# Patient Record
Sex: Male | Born: 1985 | Race: Black or African American | Hispanic: No | Marital: Single | Smoking: Current every day smoker
Health system: Southern US, Community
[De-identification: ages and names within clinical notes are randomized; demographics above are authoritative.]

## PROBLEM LIST (undated history)

## (undated) DIAGNOSIS — R011 Cardiac murmur, unspecified: Secondary | ICD-10-CM

## (undated) DIAGNOSIS — J189 Pneumonia, unspecified organism: Secondary | ICD-10-CM

## (undated) HISTORY — PX: NO PAST SURGERIES: SHX2092

---

## 2013-03-01 ENCOUNTER — Encounter (HOSPITAL_COMMUNITY): Payer: Self-pay | Admitting: Emergency Medicine

## 2013-03-01 ENCOUNTER — Emergency Department (HOSPITAL_COMMUNITY): Payer: Self-pay

## 2013-03-01 ENCOUNTER — Inpatient Hospital Stay (HOSPITAL_COMMUNITY)
Admission: EM | Admit: 2013-03-01 | Discharge: 2013-03-03 | DRG: 419 | Disposition: A | Discharge status: Home / Self Care | Payer: MEDICAID | Attending: General Surgery | Admitting: General Surgery

## 2013-03-01 DIAGNOSIS — R933 Abnormal findings on diagnostic imaging of other parts of digestive tract: Secondary | ICD-10-CM

## 2013-03-01 DIAGNOSIS — R1013 Epigastric pain: Secondary | ICD-10-CM

## 2013-03-01 DIAGNOSIS — R7402 Elevation of levels of lactic acid dehydrogenase (LDH): Secondary | ICD-10-CM

## 2013-03-01 DIAGNOSIS — K8010 Calculus of gallbladder with chronic cholecystitis without obstruction: Secondary | ICD-10-CM

## 2013-03-01 DIAGNOSIS — F17200 Nicotine dependence, unspecified, uncomplicated: Secondary | ICD-10-CM | POA: Diagnosis present

## 2013-03-01 DIAGNOSIS — R7401 Elevation of levels of liver transaminase levels: Secondary | ICD-10-CM | POA: Diagnosis present

## 2013-03-01 DIAGNOSIS — K8020 Calculus of gallbladder without cholecystitis without obstruction: Secondary | ICD-10-CM

## 2013-03-01 DIAGNOSIS — K810 Acute cholecystitis: Secondary | ICD-10-CM

## 2013-03-01 DIAGNOSIS — K389 Disease of appendix, unspecified: Secondary | ICD-10-CM | POA: Diagnosis present

## 2013-03-01 DIAGNOSIS — K8000 Calculus of gallbladder with acute cholecystitis without obstruction: Principal | ICD-10-CM | POA: Diagnosis present

## 2013-03-01 DIAGNOSIS — R932 Abnormal findings on diagnostic imaging of liver and biliary tract: Secondary | ICD-10-CM

## 2013-03-01 HISTORY — DX: Pneumonia, unspecified organism: J18.9

## 2013-03-01 HISTORY — DX: Cardiac murmur, unspecified: R01.1

## 2013-03-01 LAB — POCT I-STAT, CHEM 8
Calcium, Ion: 1.19 mmol/L (ref 1.12–1.23)
Glucose, Bld: 106 mg/dL — ABNORMAL HIGH (ref 70–99)
HCT: 52 % (ref 39.0–52.0)
Hemoglobin: 17.7 g/dL — ABNORMAL HIGH (ref 13.0–17.0)
Potassium: 3.3 mEq/L — ABNORMAL LOW (ref 3.5–5.1)

## 2013-03-01 LAB — CBC
HCT: 46.8 % (ref 39.0–52.0)
Hemoglobin: 16.9 g/dL (ref 13.0–17.0)
MCV: 88.5 fL (ref 78.0–100.0)
Platelets: 249 10*3/uL (ref 150–400)
RBC: 5.29 MIL/uL (ref 4.22–5.81)
WBC: 12.1 10*3/uL — ABNORMAL HIGH (ref 4.0–10.5)

## 2013-03-01 LAB — COMPREHENSIVE METABOLIC PANEL
AST: 507 U/L — ABNORMAL HIGH (ref 0–37)
CO2: 26 mEq/L (ref 19–32)
Chloride: 99 mEq/L (ref 96–112)
Creatinine, Ser: 0.87 mg/dL (ref 0.50–1.35)
GFR calc non Af Amer: >90 mL/min (ref >90)
Glucose, Bld: 109 mg/dL — ABNORMAL HIGH (ref 70–99)
Total Bilirubin: 3.6 mg/dL — ABNORMAL HIGH (ref 0.3–1.2)

## 2013-03-01 MED ORDER — SODIUM CHLORIDE 0.9 % IV BOLUS (SEPSIS)
1000.0000 mL | Freq: Once | INTRAVENOUS | Status: AC
  Administered 2013-03-01: 1000 mL via INTRAVENOUS

## 2013-03-01 MED ORDER — ONDANSETRON HCL 4 MG/2ML IJ SOLN
4.0000 mg | Freq: Once | INTRAMUSCULAR | Status: AC
  Administered 2013-03-01: 4 mg via INTRAVENOUS

## 2013-03-01 MED ORDER — ENOXAPARIN SODIUM 30 MG/0.3ML ~~LOC~~ SOLN
30.0000 mg | SUBCUTANEOUS | Status: DC
  Administered 2013-03-01: 30 mg via SUBCUTANEOUS
  Filled 2013-03-01 (×3): qty 0.3

## 2013-03-01 MED ORDER — ONDANSETRON HCL 4 MG/2ML IJ SOLN
4.0000 mg | Freq: Four times a day (QID) | INTRAMUSCULAR | Status: DC | PRN
Start: 2013-03-01 — End: 2013-03-03
  Administered 2013-03-01 – 2013-03-02 (×2): 4 mg via INTRAVENOUS
  Filled 2013-03-01 (×3): qty 2

## 2013-03-01 MED ORDER — ACETAMINOPHEN 325 MG PO TABS: 650.0000 mg | ORAL_TABLET | Freq: Four times a day (QID) | ORAL | Status: DC | PRN

## 2013-03-01 MED ORDER — IOHEXOL 300 MG/ML  SOLN
80.0000 mL | Freq: Once | INTRAMUSCULAR | Status: AC | PRN
  Administered 2013-03-01: 80 mL via INTRAVENOUS

## 2013-03-01 MED ORDER — IOHEXOL 300 MG/ML  SOLN
25.0000 mL | INTRAMUSCULAR | Status: DC
  Administered 2013-03-01: 25 mL via ORAL

## 2013-03-01 MED ORDER — ACETAMINOPHEN 650 MG RE SUPP: 650.0000 mg | Freq: Four times a day (QID) | RECTAL | Status: DC | PRN

## 2013-03-01 MED ORDER — HYDROMORPHONE HCL PF 1 MG/ML IJ SOLN
1.0000 mg | Freq: Once | INTRAMUSCULAR | Status: AC
  Administered 2013-03-01: 1 mg via INTRAVENOUS
  Filled 2013-03-01: qty 1

## 2013-03-01 MED ORDER — POTASSIUM CHLORIDE IN NACL 20-0.9 MEQ/L-% IV SOLN
INTRAVENOUS | Status: DC
  Administered 2013-03-01 – 2013-03-03 (×2): via INTRAVENOUS
  Filled 2013-03-01 (×9): qty 1000

## 2013-03-01 MED ORDER — PANTOPRAZOLE SODIUM 40 MG IV SOLR
40.0000 mg | Freq: Every day | INTRAVENOUS | Status: DC
  Administered 2013-03-01 – 2013-03-02 (×2): 40 mg via INTRAVENOUS
  Filled 2013-03-01 (×3): qty 40

## 2013-03-01 MED ORDER — HYDROMORPHONE HCL PF 1 MG/ML IJ SOLN
1.0000 mg | Freq: Once | INTRAMUSCULAR | Status: DC
  Filled 2013-03-01: qty 1

## 2013-03-01 MED ORDER — CIPROFLOXACIN IN D5W 400 MG/200ML IV SOLN
400.0000 mg | Freq: Two times a day (BID) | INTRAVENOUS | Status: DC
  Administered 2013-03-01 – 2013-03-03 (×4): 400 mg via INTRAVENOUS
  Filled 2013-03-01 (×6): qty 200

## 2013-03-01 NOTE — ED Notes (Signed)
Per Pt: Pt has had abdominal pain since Saturday morning after drinking on Friday night. Saturday afternoon pt started having n/v. Patient c/o stabbing abdominal pain.

## 2013-03-01 NOTE — Consult Note (Signed)
[facility] Consult: 2:26 PM 03/01/2013   Referring Provider: Dr Janee Morn Primary Care Physician:   Primary Gastroenterologist:  Un assigned  Reason for Consultation:  Abnormal LFTs  HPI: Kenneth Gallagher is a 27 y.o. male.  He is visiting friends locally, but resides in [place] and is generally healthy.  Suffers from occasional heartburn if he eats spicey foods or drinks ETOh beverages to excess.  His ETOH intake is 2 to 3 beers 3 days per week.   On 3/22 PM drank 2 beers and a Smirno[employer] Ice. (Normally would have to have hard liquor in order to suffer GI s/e from drinking).  Began to have diffuse abdominal pain and n/v.  This persisted through yesterday and he took OTC laxative thinking this may helpl though had good BM on 3/22.  Had BMs of brown , loose stool but the pain and n/v persisted.  Neither Bismuth or Phenergen from his friends supply helped.  Came to ED for unrelenting sxs this AM.    Labs show transamninases in 500s, not an ETOH pattern.  T bili is 3.6 and alk phos is 210.  Lipase is 50.   Ultrasound shows GB stones, thickened GB wall.  CBD is 5.7 MM.  Liver is normal.  CT scan shows thickened base of appendix, can not rule out appendicitis; sludge vs stones seen in GB.  After meds for pain and nausea in ED, he is free of sxs though belly still a bit sore.   Tested for HIV and Hep B about one year ago and was negative.  No ilicit or IVDA.     Past Medical History  Diagnosis Date  . Heart murmur     resolved as a child  . Pneumonia     when a child    Past Surgical History  Procedure Laterality Date  . No past surgeries      Prior to Admission medications   Not on File    Scheduled Meds: . ciprofloxacin  400 mg Intravenous Q12H  . enoxaparin (LOVENOX) injection  30 mg Subcutaneous Q24H  .  HYDROmorphone (DILAUDID) injection  1 mg Intravenous Once  . pantoprazole (PROTONIX) IV  40 mg Intravenous QHS    Infusions: . 0.9 % NaCl with KCl 20 mEq / L     PRN Meds: acetaminophen, acetaminophen, ondansetron   Allergies as of 03/01/2013  . (No Known Allergies)    No family history on file.  History   Social History  . Marital Status: Single    Spouse Name: N/A    Number of Children: N/A  . Years of Education: N/A   Occupational History  . Not on file.   Social History Main Topics  . Smoking status: Current Every Day Smoker    Types: Cigarettes  . Smokeless tobacco: Not on file     Comment: Smokes 1-2 cigs everytime he drinks etoh (1-2x/week)  . Alcohol Use: 1.5 - 2 oz/week    3-4 drink(s) per week  . Drug Use: Not on file  . Sexually Active: Not on file   Other Topics Concern  . Not on file   Social History Narrative  . No narrative on file    REVIEW OF SYSTEMS: Weight stable Rare use of Ibuprofen a few times per month. No skin sores or rashes.  No dark color urine.  No itching   PHYSICAL EXAM: Vital signs in last 24 hours: Temp:  [98.4 F (36.9 C)-99 F (37.2 C)] 99 F (  37.2 C) (03/24 1350) Pulse Rate:  [64-99] 74 (03/24 1350) Resp:  [18] 18 (03/24 0457) BP: (127-156)/(73-103) 131/78 mmHg (03/24 1350) SpO2:  [95 %-100 %] 98 % (03/24 1350) Weight:  [86.138 kg (189 lb 14.4 oz)] 86.138 kg (189 lb 14.4 oz) (03/24 1350)  General: look well.  Pleasant Head:  No swelling or asymmetry  Eyes:  No icterus or pallor.  EOMI Ears:  Not HOH  Nose:  No discharge or congestion Mouth:  Pink, moist, clear oral MM Neck:  No JVD, no mass, no TMG Lungs:  Clear.  Breathing easily Heart: RRR.  No MRG Abdomen:  Soft, active BS, ND.  Minimal tenderness in mid abdomen.   Rectal: not done   Musc/Skeltl: no joint swelling or deformity Extremities:  No pedal edema  Neurologic:  Moves all 4s, oriented x 3.  Very alert.  No gross deficits Skin:  No rash, no sores, no jaundice Tattoos:  None seen Nodes:  No inguinal adenopathy   Psych:  Pleasant and cooperative.    Intake/Output from previous day:   Intake/Output this shift:    LAB RESULTS:  Recent Labs  03/01/13 0542 03/01/13 0620  WBC 12.1*  --   HGB 16.9 17.7*  HCT 46.8 52.0  PLT 249  --    BMET Lab Results  Component Value Date   NA 141 03/01/2013   NA 139 03/01/2013   K 3.3* 03/01/2013   K 3.5 03/01/2013   CL 103 03/01/2013   CL 99 03/01/2013   CO2 26 03/01/2013   GLUCOSE 106* 03/01/2013   GLUCOSE 109* 03/01/2013   BUN 8 03/01/2013   BUN 9 03/01/2013   CREATININE 1.00 03/01/2013   CREATININE 0.87 03/01/2013   CALCIUM 10.0 03/01/2013   LFT  Recent Labs  03/01/13 0542  PROT 9.2*  ALBUMIN 4.8  AST 507*  ALT 557*  ALKPHOS 210*  BILITOT 3.6*   PT/INR No results found for this basename: INR, PROTIME   Hepatitis Panel No results found for this basename: HEPBSAG, HCVAB, HEPAIGM, HEPBIGM,  in the last 72 hours C-Diff No components found with this basename: cdiff    Drugs of Abuse  No results found for this basename: labopia, cocainscrnur, labbenz, amphetmu, thcu, labbarb     RADIOLOGY STUDIES: US Abdomen Complete 03/01/2013  *RADIOLOGY REPORT*  Clinical Data:  Abdominal pain  ULTRASOUND ABDOMEN:  Technique:  Sonography of upper abdominal structures was performed.  Comparison:  None Correlation:  CT abdomen 03/01/2013  Gallbladder:  Shadowing calculi in gallbladder up to 13 mm diameter.  Gallbladder wall is thickened up to 5 mm thick.  No definite pericholecystic fluid.  Unable to assess for presence of a sonographic Murphy's sign due to prior medication administration, per technologist.  Common bile duct:  5 mm diameter, normal  Liver:  Normal appearance.  Hepatopetal portal venous flow.  IVC:  Normal appearance  Pancreas:  Normal appearance  Spleen:  Normal appearance, 5.7 cm length  Right kidney:  11.2 cm length. Normal morphology without mass or hydronephrosis.  Left kidney:  11.5 cm length. Normal morphology without mass or hydronephrosis.  Aorta:  Bifurcation obscured by bowel  gas. Visualized aorta normal caliber.  Other:  No right upper quadrant ascites identified.  Minimal right pleural effusion noted.  IMPRESSION: Multiple shadowing calculi evident within gallbladder with associated gallbladder wall thickening, raising question of acute cholecystitis. Unable to assess for presence of a sonographic Murphy's sign due to reported prior medication administration. No biliary dilatation. Minimal  right pleural effusion.   Original Report Authenticated By: Ulyses Southward, M.D.    Ct Abdomen Pelvis W Contrast 03/01/2013 .  Findings: Dependent atelectasis noted in the lung bases.  No focal abnormalities seen in the liver or spleen.  The stomach, duodenum, pancreas, and adrenal glands are unremarkable. Tumefactive sludge or stones noted in the lumen of the gallbladder. The kidneys are normal in appearance.  No abdominal aortic aneurysm.  No free fluid or lymphadenopathy in the abdomen. Imaging through the pelvis shows no free intraperitoneal fluid.  The bladder is unremarkable.  No evidence for colonic diverticulitis.  The terminal ileum is normal.  The base of the appendix is abnormally thickened at 11 mm diameter. The walls are somewhat ill-defined, but there is not a substantial amount of periappendiceal edema or inflammation.  The midportion of the appendix and the tip of the appendix are nondilated. Although the terminal ileum and cecum are well opacified, no contrast flows into the appendix.  Bone windows reveal no worrisome lytic or sclerotic osseous lesions.  IMPRESSION: There is abnormal thickening involving the base of the appendix where it measures up to 11 mm in diameter. The appendiceal lumen is not opacified.  The mid appendix and tip in the appendix are nondilated. Although there is not a substantial amount of periappendiceal edema or inflammation, acute appendicitis cannot be excluded.  Several rounded areas of altered attenuation in the gallbladder lumen.  These are probably  uncalcified stones although tumefactive sludge could have this appearance.  I discussed these findings by telephone with Dr. Jeraldine Loots at approximately [phone] on 03/01/2013.   Original Report Authenticated By: Kennith Center, M.D.     ENDOSCOPIC STUDIES: None ever  IMPRESSION: *  Acute abdominal pain with non bloody N/V and transaminases in 500s, not an alcoholic transaminase pattern.  Suspect symptomatic gall stones/sludge.  ?passed stone, ? Appendicitis.  Although he may have some etoh related gastritis, do not believer the 3 drinks he had on 3/22 account for his presenting sxs and labs.   PLAN: *  Check LFTs in AM *  No plans for Endoscopy at present.  Will start clears.  Not clear he needs abx.  *  Dr Janee Morn to see pt, has been seen by surgical PA who called me.    LOS: 0 days   Jennye Moccasin  03/01/2013, 2:26 PM Pager: [phone]  GI ATTENDING   Patient seen and examined. Laboratories and x-rays reviewed. He presents with acute abdominal pain, nausea, vomiting, and significantly elevated liver tests. Imaging reveals cholelithiasis and thickened gallbladder wall. The clinical picture is most consistent with symptomatic cholelithiasis. Possibly choledocholithiasis. I do not think this is alcohol related liver disease. Doubt appendicitis. Agree with continuing antibiotics, IV fluids, and monitoring liver tests. If his liver tests continue to improve, would proceed with laparoscopic cholecystectomy and intraoperative cholangiogram. We will follow. Thank you  Wilhemina Bonito. Eda Keys., M.D. [facility] of Gastroenterology

## 2013-03-01 NOTE — ED Provider Notes (Signed)
History     CSN: [id number]  Arrival date & time 03/01/13  4098   First MD Initiated Contact with Patient 03/01/13 [phone]      Chief Complaint  Patient presents with  . Vomiting    (Consider location/radiation/quality/duration/timing/severity/associated sxs/prior treatment) HPI History provided by patient. Sick the last 5 days. Abdominal pain for last 2 days. Pain located lower abdomen and is associated with nausea and vomiting. Pain described as sharp and constant without known alleviating factors. Patient did have some diarrhea a few days ago took over-the-counter medications and is concerned this may have caused this pain. No blood in emesis or stools. No history of same. No back pain. No hematuria. History reviewed. No pertinent past medical history.  History reviewed. No pertinent past surgical history.  No family history on file.  History  Substance Use Topics  . Smoking status: Not on file  . Smokeless tobacco: Not on file  . Alcohol Use: Not on file      Review of Systems  Constitutional: Negative for fever and chills.  HENT: Negative for neck pain and neck stiffness.   Eyes: Negative for pain.  Respiratory: Negative for shortness of breath.   Cardiovascular: Negative for chest pain.  Gastrointestinal: Positive for abdominal pain. Negative for blood in stool.  Genitourinary: Negative for dysuria.  Musculoskeletal: Negative for back pain.  Skin: Negative for rash.  Neurological: Negative for headaches.  All other systems reviewed and are negative.    Allergies  Review of patient's allergies indicates no known allergies.  Home Medications  No current outpatient prescriptions on file.  BP 145/98  Pulse 64  Temp(Src) 98.4 F (36.9 C)  Resp 18  SpO2 100%  Physical Exam  Constitutional: He is oriented to person, place, and time. He appears well-developed and well-nourished.  HENT:  Head: Normocephalic and atraumatic.  Eyes: EOM are normal. Pupils are  equal, round, and reactive to light.  Neck: Neck supple.  Cardiovascular: Normal rate, regular rhythm and intact distal pulses.   Pulmonary/Chest: Effort normal and breath sounds normal. No respiratory distress.  Abdominal: Soft. Bowel sounds are normal. He exhibits no distension and no mass.  Tender right lower quadrant and left lower quadrant with voluntary guarding. No CVA tenderness. Negative Murphy sign  Musculoskeletal: Normal range of motion. He exhibits no edema.  Neurological: He is alert and oriented to person, place, and time.  Skin: Skin is warm and dry.    ED Course  Procedures (including critical care time)  Results for orders placed during the hospital encounter of 03/01/13  CBC      Result Value Range   WBC 12.1 (*) 4.0 - 10.5 K/uL   RBC 5.29  4.22 - 5.81 MIL/uL   Hemoglobin 16.9  13.0 - 17.0 g/dL   HCT 47.8  29.5 - 62.1 %   MCV 88.5  78.0 - 100.0 fL   MCH 31.9  26.0 - 34.0 pg   MCHC 36.1 (*) 30.0 - 36.0 g/dL   RDW 30.8  65.7 - 84.6 %   Platelets 249  150 - 400 K/uL  COMPREHENSIVE METABOLIC PANEL      Result Value Range   Sodium 139  135 - 145 mEq/L   Potassium 3.5  3.5 - 5.1 mEq/L   Chloride 99  96 - 112 mEq/L   CO2 26  19 - 32 mEq/L   Glucose, Bld 109 (*) 70 - 99 mg/dL   BUN 9  6 - 23 mg/dL  Creatinine, Ser 0.87  0.50 - 1.35 mg/dL   Calcium 40.9  8.4 - 81.1 mg/dL   Total Protein 9.2 (*) 6.0 - 8.3 g/dL   Albumin 4.8  3.5 - 5.2 g/dL   AST 914 (*) 0 - 37 U/L   ALT 557 (*) 0 - 53 U/L   Alkaline Phosphatase 210 (*) 39 - 117 U/L   Total Bilirubin 3.6 (*) 0.3 - 1.2 mg/dL   GFR calc non Af Amer >90  >90 mL/min   GFR calc Af Amer >90  >90 mL/min  POCT I-STAT, CHEM 8      Result Value Range   Sodium 141  135 - 145 mEq/L   Potassium 3.3 (*) 3.5 - 5.1 mEq/L   Chloride 103  96 - 112 mEq/L   BUN 8  6 - 23 mg/dL   Creatinine, Ser 7.82  0.50 - 1.35 mg/dL   Glucose, Bld 956 (*) 70 - 99 mg/dL   Calcium, Ion 2.13  0.86 - 1.23 mmol/L   TCO2 28  0 - 100 mmol/L    Hemoglobin 17.7 (*) 13.0 - 17.0 g/dL   HCT 57.8  46.9 - 62.9 %   IV fluids. IV Zofran. IV Dilaudid  CT scan ordered to evaluate symptoms - pending at 7 AM and patient care transferred to Dr. Jeraldine Loots MDM   Abdominal pain with elevated liver enzymes and bilirubin  IV fluids. IV narcotics        Sunnie Nielsen, MD 03/02/13 [phone]

## 2013-03-01 NOTE — H&P (Signed)
Alcohol induced hepatitis vs cholecystitis/choledocholithiasis.  Plan GI consult.  If work-up C/W cholecystitis, will evaluate appendix at time of lap chole. I D/W patient and answered his questions. Patient examined and I agree with the assessment and plan  Violeta Gelinas, MD, MPH, FACS Pager: [phone]  03/01/2013 3:49 PM

## 2013-03-01 NOTE — H&P (Signed)
Kenneth Gallagher 26-Sep-1986  [id number].    Requesting MD: Dr. Dierdre Highman Chief Complaint/Reason for Admission: Abdominal pain, nausea, vomiting HPI:  27 y/o male presents to [facility] after 3 days of progressively worsening abdominal pain.  He notes on Friday night he went out drinking with his friend.  He is from [place] here visiting a friend in the [place] area.  He states on Saturday am he started having abdominal pain and later that afternoon he was having nausea and vomiting.  He was having trouble keeping it down.  The pain became so unbearable today that he came to the hospital for evaluation.  The patient said the pain was in his lower abdomen.  It was also associated with diarrhea and anorexia.  No blood in emesis or stools.  The patient notes he has always had "nerve issues" with his stomach.  He also notes the last few times he drank he got sick for a few days.  The pt admits to drinking >3 drinks last night, but is usually a beer or smirnoff drink.  He states he has tried cut back on hard liquor he drinks.  He denies any IVDU or other drug use, no blood transfusions.  No h/o jaundice.  No recent tylenol use.  Recent travel from [place] to [place].  No medical conditions or past surgeries.    Workup showed transaminitis (AST 507, ALT 557), elevated total bilirubin (3.6), elevated alk phos (210).  CT showed abnormal thickening of the base of the appendix (11mm), and the gallbladder on ultrasound showed gallbladder wall thickened, stones and sludge.  No biliary dilatation.  Right pleural effusion on ultrasound, normal liver on CT.  The patient is currently asymptomatic (no abdominal pain, N/V, but is very emotional about the possibility of having surgery).  He is also apprehensive about having surgery here since he is from [place] and is just here visiting.     No family history on file.  History reviewed. No pertinent past medical history.  History reviewed. No pertinent past  surgical history.  Social History:  reports that he has been smoking Cigarettes.  He has been smoking about 0.00 packs per day. He does not have any smokeless tobacco history on file. He reports that he drinks about 1.5 ounces of alcohol per week. His drug history is not on file.  Allergies: No Known Allergies   (Not in a hospital admission)  Blood pressure 134/73, pulse 93, temperature 98.6 F (37 C), temperature source Oral, resp. rate 18, SpO2 99.00%. Physical Exam: General: pleasant, WD/WN male who is laying in bed in NAD, but anxious HEENT: head is normocephalic, atraumatic.  Sclera are noninjected.  PERRL.  Ears and nose without any masses or lesions.  Mouth is pink and moist Heart: regular, rate, and rhythm.  No obvious murmurs, gallops, or rubs noted.  Palpable pedal pulses bilaterally Lungs: CTAB, no wheezes, rhonchi, or rales noted.  Respiratory effort nonlabored Abd: soft, NT/ND, +BS, no masses, hernias, or organomegaly MS: all 4 extremities are symmetrical with no cyanosis, clubbing, or edema. Skin: warm and dry with no masses, lesions, or rashes Psych: A&Ox3 with an appropriate affect.  Results for orders placed during the hospital encounter of 03/01/13 (from the past 48 hour(s))  CBC     Status: Abnormal   Collection Time    03/01/13  5:42 AM      Result Value Range   WBC 12.1 (*) 4.0 - 10.5 K/uL   RBC 5.29  4.22 - 5.81 MIL/uL   Hemoglobin 16.9  13.0 - 17.0 g/dL   HCT 04.5  40.9 - 81.1 %   MCV 88.5  78.0 - 100.0 fL   MCH 31.9  26.0 - 34.0 pg   MCHC 36.1 (*) 30.0 - 36.0 g/dL   RDW 91.4  78.2 - 95.6 %   Platelets 249  150 - 400 K/uL  COMPREHENSIVE METABOLIC PANEL     Status: Abnormal   Collection Time    03/01/13  5:42 AM      Result Value Range   Sodium 139  135 - 145 mEq/L   Potassium 3.5  3.5 - 5.1 mEq/L   Chloride 99  96 - 112 mEq/L   CO2 26  19 - 32 mEq/L   Glucose, Bld 109 (*) 70 - 99 mg/dL   BUN 9  6 - 23 mg/dL   Creatinine, Ser 2.13  0.50 - 1.35 mg/dL    Calcium 08.6  8.4 - 10.5 mg/dL   Total Protein 9.2 (*) 6.0 - 8.3 g/dL   Albumin 4.8  3.5 - 5.2 g/dL   AST 578 (*) 0 - 37 U/L   ALT 557 (*) 0 - 53 U/L   Alkaline Phosphatase 210 (*) 39 - 117 U/L   Total Bilirubin 3.6 (*) 0.3 - 1.2 mg/dL   GFR calc non Af Amer >90  >90 mL/min   GFR calc Af Amer >90  >90 mL/min   Comment:            The eGFR has been calculated     using the CKD EPI equation.     This calculation has not been     validated in all clinical     situations.     eGFR's persistently     <90 mL/min signify     possible Chronic Kidney Disease.  LIPASE, BLOOD     Status: None   Collection Time    03/01/13  5:42 AM      Result Value Range   Lipase 50  11 - 59 U/L  POCT I-STAT, CHEM 8     Status: Abnormal   Collection Time    03/01/13  6:20 AM      Result Value Range   Sodium 141  135 - 145 mEq/L   Potassium 3.3 (*) 3.5 - 5.1 mEq/L   Chloride 103  96 - 112 mEq/L   BUN 8  6 - 23 mg/dL   Creatinine, Ser 4.69  0.50 - 1.35 mg/dL   Glucose, Bld 629 (*) 70 - 99 mg/dL   Calcium, Ion 5.28  4.13 - 1.23 mmol/L   TCO2 28  0 - 100 mmol/L   Hemoglobin 17.7 (*) 13.0 - 17.0 g/dL   HCT 24.4  01.0 - 27.2 %   US Abdomen Complete  03/01/2013  *RADIOLOGY REPORT*  Clinical Data:  Abdominal pain  ULTRASOUND ABDOMEN:  Technique:  Sonography of upper abdominal structures was performed.  Comparison:  None Correlation:  CT abdomen 03/01/2013  Gallbladder:  Shadowing calculi in gallbladder up to 13 mm diameter.  Gallbladder wall is thickened up to 5 mm thick.  No definite pericholecystic fluid.  Unable to assess for presence of a sonographic Murphy's sign due to prior medication administration, per technologist.  Common bile duct:  5 mm diameter, normal  Liver:  Normal appearance.  Hepatopetal portal venous flow.  IVC:  Normal appearance  Pancreas:  Normal appearance  Spleen:  Normal appearance, 5.7 cm length  Right kidney:  11.2 cm length. Normal morphology without mass or hydronephrosis.  Left  kidney:  11.5 cm length. Normal morphology without mass or hydronephrosis.  Aorta:  Bifurcation obscured by bowel gas. Visualized aorta normal caliber.  Other:  No right upper quadrant ascites identified.  Minimal right pleural effusion noted.  IMPRESSION: Multiple shadowing calculi evident within gallbladder with associated gallbladder wall thickening, raising question of acute cholecystitis. Unable to assess for presence of a sonographic Murphy's sign due to reported prior medication administration. No biliary dilatation. Minimal right pleural effusion.   Original Report Authenticated By: Ulyses Southward, M.D.    Ct Abdomen Pelvis W Contrast  03/01/2013  *RADIOLOGY REPORT*  Clinical Data: Diffuse abdominal pain with nausea vomiting.  CT ABDOMEN AND PELVIS WITH CONTRAST  Technique:  Multidetector CT imaging of the abdomen and pelvis was performed following the standard protocol during bolus administration of intravenous contrast.  Contrast: 80mL OMNIPAQUE IOHEXOL 300 MG/ML  SOLN the  Comparison: None.  Findings: Dependent atelectasis noted in the lung bases.  No focal abnormalities seen in the liver or spleen.  The stomach, duodenum, pancreas, and adrenal glands are unremarkable. Tumefactive sludge or stones noted in the lumen of the gallbladder. The kidneys are normal in appearance.  No abdominal aortic aneurysm.  No free fluid or lymphadenopathy in the abdomen. Imaging through the pelvis shows no free intraperitoneal fluid.  The bladder is unremarkable.  No evidence for colonic diverticulitis.  The terminal ileum is normal.  The base of the appendix is abnormally thickened at 11 mm diameter. The walls are somewhat ill-defined, but there is not a substantial amount of periappendiceal edema or inflammation.  The midportion of the appendix and the tip of the appendix are nondilated. Although the terminal ileum and cecum are well opacified, no contrast flows into the appendix.  Bone windows reveal no worrisome lytic or  sclerotic osseous lesions.  IMPRESSION: There is abnormal thickening involving the base of the appendix where it measures up to 11 mm in diameter. The appendiceal lumen is not opacified.  The mid appendix and tip in the appendix are nondilated. Although there is not a substantial amount of periappendiceal edema or inflammation, acute appendicitis cannot be excluded.  Several rounded areas of altered attenuation in the gallbladder lumen.  These are probably uncalcified stones although tumefactive sludge could have this appearance.  I discussed these findings by telephone with Dr. Jeraldine Loots at approximately [phone] hours on 03/01/2013.   Original Report Authenticated By: Kennith Center, M.D.        Assessment/Plan Lower abdominal pain - resolved Cholelithiasis, GB wall thickening, possible he has cholecystitis, but could be from hepatitis given LFT's Abnormal appendix, could be appendicitis, but currently asymptomatic ETOH use/tobacco use Nausea/vomiting - resolved Transaminitis - ETOH vs hepatitis  1.  Admit to [facility] service 2.  GI consult - Talked to Maralyn Sago [facility] from [facility] who seemed to think it was alcohol hepatitis, ordered Hepatitis panel 3.  IVF, pain control, antiemetics, antibiotics (cipro) 4.  NPO until Dr. Janee Morn can see and evaluate, may need HIDA scan? 5.  SCD's, Lovenox, ambulation 6.  Recheck labs in AM    [place], [facility] 03/01/2013, 10:43 AM Pager: [phone]

## 2013-03-01 NOTE — ED Provider Notes (Signed)
I reviewed the patient's radiographic studies, and discussed his case with our radiologists.  Given the elevated liver enzymes, concern for acute cholecystitis I spoke with our surgical team.  The patient remained in no distress, but will be admitted for further evaluation and management.  Gerhard Munch, MD 03/01/13 1125

## 2013-03-02 ENCOUNTER — Encounter (HOSPITAL_COMMUNITY): Admission: EM | Disposition: A | Discharge status: Home / Self Care | Payer: Self-pay | Source: Home / Self Care

## 2013-03-02 ENCOUNTER — Inpatient Hospital Stay (HOSPITAL_COMMUNITY): Payer: MEDICAID

## 2013-03-02 ENCOUNTER — Encounter (HOSPITAL_COMMUNITY): Payer: Self-pay | Admitting: Certified Registered"

## 2013-03-02 ENCOUNTER — Inpatient Hospital Stay (HOSPITAL_COMMUNITY): Payer: MEDICAID | Admitting: Certified Registered"

## 2013-03-02 DIAGNOSIS — R1013 Epigastric pain: Secondary | ICD-10-CM

## 2013-03-02 DIAGNOSIS — D126 Benign neoplasm of colon, unspecified: Secondary | ICD-10-CM

## 2013-03-02 DIAGNOSIS — K8020 Calculus of gallbladder without cholecystitis without obstruction: Secondary | ICD-10-CM

## 2013-03-02 DIAGNOSIS — R933 Abnormal findings on diagnostic imaging of other parts of digestive tract: Secondary | ICD-10-CM

## 2013-03-02 DIAGNOSIS — K810 Acute cholecystitis: Secondary | ICD-10-CM

## 2013-03-02 HISTORY — PX: CHOLECYSTECTOMY: SHX55

## 2013-03-02 HISTORY — PX: LAPAROSCOPIC APPENDECTOMY: SHX408

## 2013-03-02 LAB — SURGICAL PCR SCREEN: Staphylococcus aureus: NEGATIVE

## 2013-03-02 LAB — COMPREHENSIVE METABOLIC PANEL
AST: 251 U/L — ABNORMAL HIGH (ref 0–37)
Albumin: 3.7 g/dL (ref 3.5–5.2)
Alkaline Phosphatase: 194 U/L — ABNORMAL HIGH (ref 39–117)
BUN: 7 mg/dL (ref 6–23)
Potassium: 4.1 mEq/L (ref 3.5–5.1)
Sodium: 141 mEq/L (ref 135–145)
Total Protein: 7.4 g/dL (ref 6.0–8.3)

## 2013-03-02 LAB — CBC
HCT: 44.5 % (ref 39.0–52.0)
MCV: 90.1 fL (ref 78.0–100.0)
RBC: 4.94 MIL/uL (ref 4.22–5.81)
RDW: 13 % (ref 11.5–15.5)
WBC: 7.5 10*3/uL (ref 4.0–10.5)

## 2013-03-02 LAB — HEPATITIS PANEL, ACUTE
HCV Ab: NEGATIVE
Hep A IgM: NEGATIVE
Hep B C IgM: NEGATIVE
Hepatitis B Surface Ag: NEGATIVE

## 2013-03-02 SURGERY — LAPAROSCOPIC CHOLECYSTECTOMY WITH INTRAOPERATIVE CHOLANGIOGRAM
Anesthesia: General | Site: Abdomen | Wound class: Contaminated

## 2013-03-02 MED ORDER — BUPIVACAINE-EPINEPHRINE 0.25% -1:200000 IJ SOLN
INTRAMUSCULAR | Status: DC | PRN
  Administered 2013-03-02: 19 mL

## 2013-03-02 MED ORDER — MORPHINE SULFATE 2 MG/ML IJ SOLN
2.0000 mg | INTRAMUSCULAR | Status: DC | PRN
  Administered 2013-03-02 – 2013-03-03 (×3): 2 mg via INTRAVENOUS
  Filled 2013-03-02 (×2): qty 1

## 2013-03-02 MED ORDER — MORPHINE SULFATE 2 MG/ML IJ SOLN
INTRAMUSCULAR | Status: AC
  Filled 2013-03-02: qty 1

## 2013-03-02 MED ORDER — SODIUM CHLORIDE 0.9 % IV SOLN
INTRAVENOUS | Status: DC | PRN
  Administered 2013-03-02: 13:00:00

## 2013-03-02 MED ORDER — 0.9 % SODIUM CHLORIDE (POUR BTL) OPTIME
TOPICAL | Status: DC | PRN
  Administered 2013-03-02: 1000 mL

## 2013-03-02 MED ORDER — MIDAZOLAM HCL 5 MG/5ML IJ SOLN
INTRAMUSCULAR | Status: DC | PRN
  Administered 2013-03-02: 2 mg via INTRAVENOUS

## 2013-03-02 MED ORDER — HYDROMORPHONE HCL PF 1 MG/ML IJ SOLN
0.2500 mg | INTRAMUSCULAR | Status: DC | PRN
  Administered 2013-03-02: 0.25 mg via INTRAVENOUS
  Administered 2013-03-02 (×2): 0.5 mg via INTRAVENOUS
  Administered 2013-03-02: 0.25 mg via INTRAVENOUS

## 2013-03-02 MED ORDER — BUPIVACAINE-EPINEPHRINE PF 0.25-1:200000 % IJ SOLN
INTRAMUSCULAR | Status: AC
  Filled 2013-03-02: qty 30

## 2013-03-02 MED ORDER — GLYCOPYRROLATE 0.2 MG/ML IJ SOLN
INTRAMUSCULAR | Status: DC | PRN
  Administered 2013-03-02: .4 mg via INTRAVENOUS

## 2013-03-02 MED ORDER — LIDOCAINE HCL 4 % MT SOLN
OROMUCOSAL | Status: DC | PRN
  Administered 2013-03-02: 4 mL via TOPICAL

## 2013-03-02 MED ORDER — ONDANSETRON HCL 4 MG/2ML IJ SOLN
INTRAMUSCULAR | Status: DC | PRN
  Administered 2013-03-02: 4 mg via INTRAVENOUS

## 2013-03-02 MED ORDER — HYDROMORPHONE HCL PF 1 MG/ML IJ SOLN
INTRAMUSCULAR | Status: AC
  Administered 2013-03-02: 1 mg
  Filled 2013-03-02: qty 1

## 2013-03-02 MED ORDER — ENOXAPARIN SODIUM 40 MG/0.4ML ~~LOC~~ SOLN
40.0000 mg | SUBCUTANEOUS | Status: DC
  Administered 2013-03-03: 40 mg via SUBCUTANEOUS
  Filled 2013-03-02 (×2): qty 0.4

## 2013-03-02 MED ORDER — PROPOFOL 10 MG/ML IV BOLUS
INTRAVENOUS | Status: DC | PRN
  Administered 2013-03-02: 150 mg via INTRAVENOUS
  Administered 2013-03-02: 50 mg via INTRAVENOUS

## 2013-03-02 MED ORDER — FENTANYL CITRATE 0.05 MG/ML IJ SOLN
INTRAMUSCULAR | Status: DC | PRN
  Administered 2013-03-02: 100 ug via INTRAVENOUS
  Administered 2013-03-02: 50 ug via INTRAVENOUS
  Administered 2013-03-02: 100 ug via INTRAVENOUS
  Administered 2013-03-02 (×5): 50 ug via INTRAVENOUS

## 2013-03-02 MED ORDER — SODIUM CHLORIDE 0.9 % IR SOLN
Status: DC | PRN
  Administered 2013-03-02: 1

## 2013-03-02 MED ORDER — NEOSTIGMINE METHYLSULFATE 1 MG/ML IJ SOLN
INTRAMUSCULAR | Status: DC | PRN
  Administered 2013-03-02: 3 mg via INTRAVENOUS

## 2013-03-02 MED ORDER — ROCURONIUM BROMIDE 100 MG/10ML IV SOLN
INTRAVENOUS | Status: DC | PRN
  Administered 2013-03-02: 40 mg via INTRAVENOUS

## 2013-03-02 MED ORDER — WHITE PETROLATUM GEL
Status: AC
  Administered 2013-03-02: 0.2
  Filled 2013-03-02: qty 5

## 2013-03-02 MED ORDER — LACTATED RINGERS IV SOLN
INTRAVENOUS | Status: DC | PRN
  Administered 2013-03-02 (×2): via INTRAVENOUS

## 2013-03-02 MED ORDER — DIPHENHYDRAMINE HCL 25 MG PO CAPS
25.0000 mg | ORAL_CAPSULE | Freq: Three times a day (TID) | ORAL | Status: DC | PRN
  Administered 2013-03-02: 25 mg via ORAL
  Filled 2013-03-02: qty 1

## 2013-03-02 MED ORDER — LACTATED RINGERS IV SOLN
INTRAVENOUS | Status: DC
  Administered 2013-03-02: 11:00:00 via INTRAVENOUS

## 2013-03-02 MED ORDER — OXYCODONE HCL 5 MG PO TABS
5.0000 mg | ORAL_TABLET | ORAL | Status: DC | PRN
  Administered 2013-03-02 – 2013-03-03 (×3): 10 mg via ORAL
  Filled 2013-03-02 (×3): qty 2

## 2013-03-02 MED ORDER — LIDOCAINE HCL (CARDIAC) 20 MG/ML IV SOLN
INTRAVENOUS | Status: DC | PRN
  Administered 2013-03-02: 80 mg via INTRAVENOUS

## 2013-03-02 MED ORDER — HYDROMORPHONE HCL PF 1 MG/ML IJ SOLN
INTRAMUSCULAR | Status: DC | PRN
  Administered 2013-03-02 (×2): 0.5 mg via INTRAVENOUS

## 2013-03-02 SURGICAL SUPPLY — 54 items
APPLIER CLIP 5 13 M/L LIGAMAX5 (MISCELLANEOUS) ×3
APPLIER CLIP ROT 10 11.4 M/L (STAPLE)
BLADE SURG ROTATE 9660 (MISCELLANEOUS) IMPLANT
CANISTER SUCTION 2500CC (MISCELLANEOUS) ×3 IMPLANT
CHLORAPREP W/TINT 26ML (MISCELLANEOUS) ×3 IMPLANT
CLIP APPLIE 5 13 M/L LIGAMAX5 (MISCELLANEOUS) ×2 IMPLANT
CLIP APPLIE ROT 10 11.4 M/L (STAPLE) IMPLANT
CLOTH BEACON ORANGE TIMEOUT ST (SAFETY) ×3 IMPLANT
COVER MAYO STAND STRL (DRAPES) ×3 IMPLANT
COVER SURGICAL LIGHT HANDLE (MISCELLANEOUS) ×3 IMPLANT
CUTTER LINEAR ENDO 35 ETS (STAPLE) IMPLANT
CUTTER LINEAR ENDO 35 ETS TH (STAPLE) IMPLANT
DECANTER SPIKE VIAL GLASS SM (MISCELLANEOUS) IMPLANT
DERMABOND ADVANCED (GAUZE/BANDAGES/DRESSINGS)
DERMABOND ADVANCED .7 DNX12 (GAUZE/BANDAGES/DRESSINGS) IMPLANT
DRAPE C-ARM 42X72 X-RAY (DRAPES) ×3 IMPLANT
DRAPE UTILITY 15X26 W/TAPE STR (DRAPE) ×6 IMPLANT
ELECT REM PT RETURN 9FT ADLT (ELECTROSURGICAL) ×3
ELECTRODE REM PT RTRN 9FT ADLT (ELECTROSURGICAL) ×2 IMPLANT
ENDOLOOP SUT PDS II  0 18 (SUTURE)
ENDOLOOP SUT PDS II 0 18 (SUTURE) IMPLANT
FILTER SMOKE EVAC LAPAROSHD (FILTER) IMPLANT
GLOVE BIO SURGEON STRL SZ7.5 (GLOVE) ×6 IMPLANT
GLOVE BIO SURGEON STRL SZ8 (GLOVE) ×3 IMPLANT
GLOVE BIOGEL PI IND STRL 7.5 (GLOVE) ×4 IMPLANT
GLOVE BIOGEL PI IND STRL 8 (GLOVE) ×2 IMPLANT
GLOVE BIOGEL PI INDICATOR 7.5 (GLOVE) ×2
GLOVE BIOGEL PI INDICATOR 8 (GLOVE) ×1
GOWN PREVENTION PLUS XLARGE (GOWN DISPOSABLE) ×3 IMPLANT
GOWN STRL NON-REIN LRG LVL3 (GOWN DISPOSABLE) ×6 IMPLANT
KIT BASIN OR (CUSTOM PROCEDURE TRAY) ×3 IMPLANT
KIT ROOM TURNOVER OR (KITS) ×3 IMPLANT
NS IRRIG 1000ML POUR BTL (IV SOLUTION) ×3 IMPLANT
PAD ARMBOARD 7.5X6 YLW CONV (MISCELLANEOUS) ×6 IMPLANT
POUCH SPECIMEN RETRIEVAL 10MM (ENDOMECHANICALS) ×6 IMPLANT
RELOAD /EVU35 (ENDOMECHANICALS) IMPLANT
RELOAD CUTTER ETS 35MM STAND (ENDOMECHANICALS) IMPLANT
RELOAD STAPLE TA45 3.5 REG BLU (ENDOMECHANICALS) ×3 IMPLANT
SCALPEL HARMONIC ACE (MISCELLANEOUS) ×3 IMPLANT
SCISSORS LAP 5X35 DISP (ENDOMECHANICALS) ×3 IMPLANT
SET CHOLANGIOGRAPH 5 50 .035 (SET/KITS/TRAYS/PACK) ×3 IMPLANT
SET IRRIG TUBING LAPAROSCOPIC (IRRIGATION / IRRIGATOR) ×3 IMPLANT
SPECIMEN JAR SMALL (MISCELLANEOUS) ×3 IMPLANT
SUT VIC AB 4-0 PS2 27 (SUTURE) ×3 IMPLANT
SWAB COLLECTION DEVICE MRSA (MISCELLANEOUS) IMPLANT
TOWEL OR 17X24 6PK STRL BLUE (TOWEL DISPOSABLE) ×3 IMPLANT
TOWEL OR 17X26 10 PK STRL BLUE (TOWEL DISPOSABLE) ×3 IMPLANT
TRAY FOLEY CATH 14FR (SET/KITS/TRAYS/PACK) ×3 IMPLANT
TRAY LAPAROSCOPIC (CUSTOM PROCEDURE TRAY) ×3 IMPLANT
TROCAR HASSON GELL 12X100 (TROCAR) ×3 IMPLANT
TROCAR Z-THREAD FIOS 12X100MM (TROCAR) ×3 IMPLANT
TROCAR Z-THREAD FIOS 5X100MM (TROCAR) ×12 IMPLANT
TUBE ANAEROBIC SPECIMEN COL (MISCELLANEOUS) IMPLANT
WATER STERILE IRR 1000ML POUR (IV SOLUTION) IMPLANT

## 2013-03-02 NOTE — Progress Notes (Signed)
Appreciate Dr. Lamar Sprinkles input.  Will plan lap chole with IOC today. I discussed the procedure in detail.  We discussed the risks and benefits of a laparoscopic cholecystectomy and possible cholangiogram including, but not limited to bleeding, infection, injury to surrounding structures such as the intestine or liver, bile leak, retained gallstones, need to convert to an open procedure, prolonged diarrhea, blood clots such as  DVT, common bile duct injury, anesthesia risks, and possible need for additional procedures.  The likelihood of improvement in symptoms and return to the patient's normal status is good. We discussed the typical post-operative recovery course. Additionally, his appendix appears abnormal on CT - it is distended with mucous.  Due to the small but real risk of mucocoel or mucinous adenocarcinoma I will also do appendectomy. Patient examined and I agree with the assessment and plan  Violeta Gelinas, MD, MPH, FACS Pager: [phone]  03/02/2013 8:15 AM

## 2013-03-02 NOTE — Progress Notes (Signed)
Subjective: Pt states he is doing fine, no pain, n/v.  Pt having BM's and passing flatus.  Eating well, but now NPO for surgery.  Pt ambulating well.    Objective: Vital signs in last 24 hours: Temp:  [97.3 F (36.3 C)-99 F (37.2 C)] 97.9 F (36.6 C) (03/25 0543) Pulse Rate:  [65-99] 72 (03/25 0543) Resp:  [16-20] 16 (03/25 0543) BP: (127-151)/(73-94) 142/85 mmHg (03/25 0543) SpO2:  [96 %-100 %] 100 % (03/25 0543) Weight:  [189 lb 14.4 oz (86.138 kg)] 189 lb 14.4 oz (86.138 kg) (03/24 1350) Last BM Date: 03/01/13  Intake/Output from previous day: 03/24 0701 - 03/25 0700 In: 1800 [I.V.:1000; IV Piggyback:800] Out: 8 [Urine:6; Stool:2] Intake/Output this shift:    PE: Gen:  Alert, NAD, pleasant Abd: Soft, NT/ND, +BS, no HSM   Lab Results:   Recent Labs  03/01/13 0542 03/01/13 0620  WBC 12.1*  --   HGB 16.9 17.7*  HCT 46.8 52.0  PLT 249  --    BMET  Recent Labs  03/01/13 0542 03/01/13 0620 03/02/13 0635  NA 139 141 141  K 3.5 3.3* 4.1  CL 99 103 105  CO2 26  --  27  GLUCOSE 109* 106* 87  BUN 9 8 7   CREATININE 0.87 1.00 1.06  CALCIUM 10.0  --  9.3   PT/INR No results found for this basename: LABPROT, INR,  in the last 72 hours CMP     Component Value Date/Time   NA 141 03/02/2013 0635   K 4.1 03/02/2013 0635   CL 105 03/02/2013 0635   CO2 27 03/02/2013 0635   GLUCOSE 87 03/02/2013 0635   BUN 7 03/02/2013 0635   CREATININE 1.06 03/02/2013 0635   CALCIUM 9.3 03/02/2013 0635   PROT 7.4 03/02/2013 0635   ALBUMIN 3.7 03/02/2013 0635   AST 251* 03/02/2013 0635   ALT 480* 03/02/2013 0635   ALKPHOS 194* 03/02/2013 0635   BILITOT 1.7* 03/02/2013 0635   GFRNONAA >90 03/02/2013 0635   GFRAA >90 03/02/2013 0635   Lipase     Component Value Date/Time   LIPASE 62* 03/02/2013 0635       Studies/Results: US Abdomen Complete  03/01/2013  *RADIOLOGY REPORT*  Clinical Data:  Abdominal pain  ULTRASOUND ABDOMEN:  Technique:  Sonography of upper abdominal structures  was performed.  Comparison:  None Correlation:  CT abdomen 03/01/2013  Gallbladder:  Shadowing calculi in gallbladder up to 13 mm diameter.  Gallbladder wall is thickened up to 5 mm thick.  No definite pericholecystic fluid.  Unable to assess for presence of a sonographic Murphy's sign due to prior medication administration, per technologist.  Common bile duct:  5 mm diameter, normal  Liver:  Normal appearance.  Hepatopetal portal venous flow.  IVC:  Normal appearance  Pancreas:  Normal appearance  Spleen:  Normal appearance, 5.7 cm length  Right kidney:  11.2 cm length. Normal morphology without mass or hydronephrosis.  Left kidney:  11.5 cm length. Normal morphology without mass or hydronephrosis.  Aorta:  Bifurcation obscured by bowel gas. Visualized aorta normal caliber.  Other:  No right upper quadrant ascites identified.  Minimal right pleural effusion noted.  IMPRESSION: Multiple shadowing calculi evident within gallbladder with associated gallbladder wall thickening, raising question of acute cholecystitis. Unable to assess for presence of a sonographic Murphy's sign due to reported prior medication administration. No biliary dilatation. Minimal right pleural effusion.   Original Report Authenticated By: Ulyses Southward, M.D.    Ct Abdomen  Pelvis W Contrast  03/01/2013  *RADIOLOGY REPORT*  Clinical Data: Diffuse abdominal pain with nausea vomiting.  CT ABDOMEN AND PELVIS WITH CONTRAST  Technique:  Multidetector CT imaging of the abdomen and pelvis was performed following the standard protocol during bolus administration of intravenous contrast.  Contrast: 80mL OMNIPAQUE IOHEXOL 300 MG/ML  SOLN the  Comparison: None.  Findings: Dependent atelectasis noted in the lung bases.  No focal abnormalities seen in the liver or spleen.  The stomach, duodenum, pancreas, and adrenal glands are unremarkable. Tumefactive sludge or stones noted in the lumen of the gallbladder. The kidneys are normal in appearance.  No  abdominal aortic aneurysm.  No free fluid or lymphadenopathy in the abdomen. Imaging through the pelvis shows no free intraperitoneal fluid.  The bladder is unremarkable.  No evidence for colonic diverticulitis.  The terminal ileum is normal.  The base of the appendix is abnormally thickened at 11 mm diameter. The walls are somewhat ill-defined, but there is not a substantial amount of periappendiceal edema or inflammation.  The midportion of the appendix and the tip of the appendix are nondilated. Although the terminal ileum and cecum are well opacified, no contrast flows into the appendix.  Bone windows reveal no worrisome lytic or sclerotic osseous lesions.  IMPRESSION: There is abnormal thickening involving the base of the appendix where it measures up to 11 mm in diameter. The appendiceal lumen is not opacified.  The mid appendix and tip in the appendix are nondilated. Although there is not a substantial amount of periappendiceal edema or inflammation, acute appendicitis cannot be excluded.  Several rounded areas of altered attenuation in the gallbladder lumen.  These are probably uncalcified stones although tumefactive sludge could have this appearance.  I discussed these findings by telephone with Dr. Jeraldine Loots at approximately [phone] hours on 03/01/2013.   Original Report Authenticated By: Kennith Center, M.D.     Anti-infectives: Anti-infectives   Start     Dose/Rate Route Frequency Ordered Stop   03/01/13 1500  ciprofloxacin (CIPRO) IVPB 400 mg     400 mg 200 mL/hr over 60 Minutes Intravenous Every 12 hours 03/01/13 1345         Assessment/Plan Lower abdominal pain - resolved  Cholelithiasis, GB wall thickening, possible he has cholecystitis vs symptomatic gallstones vs choledolithiasis Abnormal appendix, could be appendicitis, but currently asymptomatic  ETOH use/tobacco use  Nausea/vomiting - resolved  Transaminitis - improving, hepatitis panel not back yet  1.  Will plan on taking pt to  OR today for lap chole and possibly lap appy 2.  NPO, held lovenox 3.  Ambulate and scds 4.  May be able to go home tomorrow or Thursday if doing well 5.  Pt understands need for surgery and consents to both procedures    LOS: 1 day    Kenneth Gallagher, Kenneth Gallagher 03/02/2013, 7:52 AM Pager: [phone]

## 2013-03-02 NOTE — Anesthesia Postprocedure Evaluation (Signed)
  Anesthesia Post-op Note  Patient: Kenneth Gallagher  Procedure(s) Performed: Procedure(s): LAPAROSCOPIC CHOLECYSTECTOMY WITH INTRAOPERATIVE CHOLANGIOGRAM (N/A) APPENDECTOMY LAPAROSCOPIC (N/A)  Patient Location: PACU  Anesthesia Type:General  Level of Consciousness: awake  Airway and Oxygen Therapy: Patient Spontanous Breathing  Post-op Pain: mild  Post-op Assessment: Post-op Vital signs reviewed  Post-op Vital Signs: Reviewed  Complications: No apparent anesthesia complications

## 2013-03-02 NOTE — Preoperative (Signed)
Beta Blockers   Reason not to administer Beta Blockers:Not Applicable 

## 2013-03-02 NOTE — Anesthesia Procedure Notes (Signed)
Procedure Name: Intubation Date/Time: 03/02/2013 12:16 PM Performed by: Armandina Gemma Pre-anesthesia Checklist: Patient identified, Timeout performed, Emergency Drugs available, Suction available and Patient being monitored Patient Re-evaluated:Patient Re-evaluated prior to inductionOxygen Delivery Method: Circle system utilized Preoxygenation: Pre-oxygenation with 100% oxygen Intubation Type: IV induction Ventilation: Mask ventilation without difficulty Laryngoscope Size: Miller and 2 Grade View: Grade I Tube type: Oral Tube size: 7.0 mm Number of attempts: 1 Airway Equipment and Method: Stylet Placement Confirmation: ETT inserted through vocal cords under direct vision,  positive ETCO2 and breath sounds checked- equal and bilateral Secured at: 22 cm Tube secured with: Tape Dental Injury: Teeth and Oropharynx as per pre-operative assessment  Comments: Atraumatic teeth as preop bilat BS per Randa Evens

## 2013-03-02 NOTE — Transfer of Care (Signed)
Immediate Anesthesia Transfer of Care Note  Patient: Kenneth Gallagher  Procedure(s) Performed: Procedure(s): LAPAROSCOPIC CHOLECYSTECTOMY WITH INTRAOPERATIVE CHOLANGIOGRAM (N/A) APPENDECTOMY LAPAROSCOPIC (N/A)  Patient Location: PACU  Anesthesia Type:General  Level of Consciousness: awake and alert   Airway & Oxygen Therapy: Patient Spontanous Breathing and Patient connected to nasal cannula oxygen  Post-op Assessment: Report given to PACU RN and Post -op Vital signs reviewed and stable  Post vital signs: Reviewed and stable  Complications: No apparent anesthesia complications

## 2013-03-02 NOTE — Progress Notes (Signed)
Returned to room 6n28 from PACU, a/ox4, denies nausea, c/o abd pain 5/10.  Friend at bedside, reoriented to room.

## 2013-03-02 NOTE — Progress Notes (Signed)
     [facility] Daily Rounding Note 03/02/2013, 10:03 AM  SUBJECTIVE:       No abd pain or nausea  OBJECTIVE:         Vital signs in last 24 hours:    Temp:  [97.3 F (36.3 C)-99 F (37.2 C)] 97.9 F (36.6 C) (03/25 0543) Pulse Rate:  [65-99] 72 (03/25 0543) Resp:  [16-20] 16 (03/25 0543) BP: (128-151)/(73-94) 142/85 mmHg (03/25 0543) SpO2:  [96 %-100 %] 100 % (03/25 0543) Weight:  [86.138 kg (189 lb 14.4 oz)] 86.138 kg (189 lb 14.4 oz) (03/24 1350) Last BM Date: 03/01/13 General: looks well.  comfortable   Heart: RRR Chest: clear B Abdomen: soft, NT, ND.  Active BS  Extremities: no CCE Neuro/Psych:  Pleasant, relaxed.  No confusion.   Intake/Output from previous day: 03/24 0701 - 03/25 0700 In: 1800 [I.V.:1000; IV Piggyback:800] Out: 8 [Urine:6; Stool:2]  Intake/Output this shift:    Lab Results:  Recent Labs  03/01/13 0542 03/01/13 0620 03/02/13 0635  WBC 12.1*  --  7.5  HGB 16.9 17.7* 15.7  HCT 46.8 52.0 44.5  PLT 249  --  198   BMET  Recent Labs  03/01/13 0542 03/01/13 0620 03/02/13 0635  NA 139 141 141  K 3.5 3.3* 4.1  CL 99 103 105  CO2 26  --  27  GLUCOSE 109* 106* 87  BUN 9 8 7   CREATININE 0.87 1.00 1.06  CALCIUM 10.0  --  9.3   LFT  Recent Labs  03/01/13 0542 03/02/13 0635  PROT 9.2* 7.4  ALBUMIN 4.8 3.7  AST 507* 251*  ALT 557* 480*  ALKPHOS 210* 194*  BILITOT 3.6* 1.7*   Studies/Results: US Abdomen Complete 03/01/2013    IMPRESSION: Multiple shadowing calculi evident within gallbladder with associated gallbladder wall thickening, raising question of acute cholecystitis. Unable to assess for presence of a sonographic Murphy's sign due to reported prior medication administration. No biliary dilatation. Minimal right pleural effusion.   Original Report Authenticated By: Ulyses Southward, M.D.    Ct Abdomen Pelvis W Contrast 03/01/2013 .  IMPRESSION: There is abnormal thickening involving the base of the appendix where it measures up to 11  mm in diameter. The appendiceal lumen is not opacified.  The mid appendix and tip in the appendix are nondilated. Although there is not a substantial amount of periappendiceal edema or inflammation, acute appendicitis cannot be excluded.  Several rounded areas of altered attenuation in the gallbladder lumen.  These are probably uncalcified stones although tumefactive sludge could have this appearance.  I discussed these findings by telephone with Dr. Jeraldine Loots at approximately [phone] on 03/01/2013.   Original Report Authenticated By: Kennith Center, M.D.     ASSESMENT: * Acute abdominal pain with non bloody N/V and transaminases in 500s, not an alcoholic transaminase pattern.  Suspect symptomatic cholelithiasis, possible choledocholithiasis. No evidence of pancreatitis.  LFTs improved. No further bouts with abd pain or n/v since treatment in ED.    PLAN: *  Note surgical, Dr Carollee Massed, plan for Lap Chole with IOC today.    LOS: 1 day   Kenneth Gallagher  03/02/2013, 10:03 AM Pager: [phone]   GI ATTENDING  Agree with above. Laparoscopic cholecystectomy performed for acute cholecystitis. Mucinous lesion of appendix removed. No choledocholithiasis. Await pathology. We are available as needed. Thank you  Wilhemina Bonito. Eda Keys., M.D. [facility] Division of Gastroenterology

## 2013-03-02 NOTE — Op Note (Signed)
03/01/2013 - 03/02/2013  1:46 PM  PATIENT:  Kenneth Gallagher  26 y.o. male  PRE-OPERATIVE DIAGNOSIS:   Cholecystitis, Abnormal appendix  POST-OPERATIVE DIAGNOSIS:  Cholecystitis, Dilated appendix with cyst on meso- appendix  PROCEDURE:  Procedure(s): LAPAROSCOPIC CHOLECYSTECTOMY WITH INTRAOPERATIVE CHOLANGIOGRAM APPENDECTOMY LAPAROSCOPIC  SURGEON:  Surgeon(s): Liz Malady, MD  PHYSICIAN ASSISTANT:   ASSISTANTS: Aris Georgia, PAC; Ralene Muskrat. PAS  ANESTHESIA:   local and general  EBL:  Total I/O In: -  Out: 325 [Urine:300; Blood:25]  BLOOD ADMINISTERED:none  DRAINS: none   SPECIMEN:  Excision  DISPOSITION OF SPECIMEN:  PATHOLOGY  COUNTS:  YES  DICTATION: .Dragon Dictation  Patient is brought for laparoscopic cholecystectomy and appendectomy. He was identified in the preop holding area. He received intravenous antibiotics. Informed consent was obtained. He was brought to  the operating room and general endotracheal anesthesia was administered by the anesthesia staff. His abdomen was prepped and draped in sterile fashion after placement of Foley catheter by nursing. Time out procedure was done. Infra-umbilical region was infiltrated with quarter percent Marcaine with epinephrine. infra umbilical incision was made. Subcutaneous tissues were dissected down revealing the anterior fascia. This was divided along the midline and the perineal cavity was entered under direct vision without difficulty. The 0 Vicryl pursestring was placed around the fascial opening. Hassan trocar was inserted. Abdomen was insufflated with carbon dioxide in standard fashion. 5 mm epigastric and right sided port x2 were placed Under direct vision. Local was used at all port sites.The dome the gallbladder was retracted superior medially. The infundibulum was covered by adhesed omentum. This was gradually swept down off the body and infundibulum. The infundibulum was retracted inferior laterally. Omentum and  filmy adhesions were further swept down. Dissection began laterally and progressed medially. This was continued until critical view was obtained between the cystic duct, the liver, and the gallbladder. Once we had excellent visualization, a clip was placed on the infundibular cystic duct junction. A small nick was made in the cystic duct. Intraoperative cholangiogram was obtained demonstrating no common bile duct filling defects and good flow of contrast into the duodenum. Cholangiocatheter was removed. 3 clips were placed proximally on the cystic duct and it was divided. Cystic artery was dissected circumferentially. This was clipped twice proximally once distally and divided. Gallbladder was taken off the liver bed using Bovie cautery and achieving hemostasis along the way. Gallbladder was placed in an Endo Catch bag and removed from the infraumbilical port site. Liver bed was irrigated and hemostasis was ensured. Clips remain in excellent position. Irrigation fluid evacuated and was clear. Attention was then directed to the appendix. An additional 12 mm left lower quadrant port was placed after initially attempting to use the ports available. The appendix was somewhat retrocecal. The cecum was mobilized from the lateral peritoneal attachments using Harmonic. Careful dissection revealed the appendix to be somewhat dilated with a visible cystic structure on the mesoappendix. Great care was taken not to rupture this area. The base of the appendix was intact this was dissected circumferentially. We then freed up the distal cecum a bed to give extra margin on the resection in case this represented a tumor. The end of the cecum was divided with Endo GIA stapler. Excellent staple line was obtained. The remainder the mesoappendix was then divided with the harmonic scalpel. Excellent hemostasis was obtained. The appendix was placed in an Endo Catch bag and removed intact via the left lower quadrant port site. Area was  copiously irrigated.  Staple line was intact. There was no bleeding. Gallbladder bed was rechecked and all appeared normal. Four-quadrant inspection revealed no abnormality. Pneumoperitoneum was released after removing ports under direct vision. Infraumbilical fascia was closed by tying the 0 Vicryl pursestring suture. All 4 wounds were copiously irrigated and the skin of each was closed with subcuticular 4-0 Vicryl followed by Dermabond. All counts were correct. Patient tolerated procedure well without apparent complication and was taken recovery in stable condition.  PATIENT DISPOSITION:  PACU - hemodynamically stable.   Delay start of Pharmacological VTE agent (>24hrs) due to surgical blood loss or risk of bleeding:  no  Violeta Gelinas, MD, MPH, FACS Pager: [phone]  3/25/20141:46 PM

## 2013-03-02 NOTE — Anesthesia Preprocedure Evaluation (Addendum)
Anesthesia Evaluation  Patient identified by MRN, date of birth, ID band Patient awake    Reviewed: Allergy & Precautions, H&P , NPO status   Airway Mallampati: II TM Distance: >3 FB Neck ROM: Full    Dental  (+) Dental Advisory Given   Pulmonary pneumonia -, resolved,  breath sounds clear to auscultation        Cardiovascular negative cardio ROS  Rhythm:Regular Rate:Normal     Neuro/Psych    GI/Hepatic Neg liver ROS, History of gallstones   Endo/Other  negative endocrine ROS  Renal/GU negative Renal ROS     Musculoskeletal   Abdominal   Peds  Hematology   Anesthesia Other Findings   Reproductive/Obstetrics                          Anesthesia Physical Anesthesia Plan  ASA: II  Anesthesia Plan: General   Post-op Pain Management:    Induction: Intravenous  Airway Management Planned: Oral ETT  Additional Equipment:   Intra-op Plan:   Post-operative Plan: Extubation in OR  Informed Consent: I have reviewed the patients History and Physical, chart, labs and discussed the procedure including the risks, benefits and alternatives for the proposed anesthesia with the patient or authorized representative who has indicated his/her understanding and acceptance.   Dental advisory given  Plan Discussed with: CRNA, Anesthesiologist and Surgeon  Anesthesia Plan Comments:         Anesthesia Quick Evaluation

## 2013-03-02 NOTE — Interval H&P Note (Signed)
History and Physical Interval Note:  03/02/2013 11:58 AM  [name]  has presented today for surgery, with the diagnosis of appendicitis, cholecystitis  The various methods of treatment have been discussed with the patient and family. After consideration of risks, benefits and other options for treatment, the patient has consented to  Procedure(s): LAPAROSCOPIC CHOLECYSTECTOMY WITH INTRAOPERATIVE CHOLANGIOGRAM (N/A) APPENDECTOMY LAPAROSCOPIC (N/A) as a surgical intervention .  The patient's history has been reviewed, patient examined, no change in status, stable for surgery.  I have reviewed the patient's chart and labs.  Questions were answered to the patient's satisfaction.     Kenneth Gallagher

## 2013-03-03 ENCOUNTER — Telehealth: Payer: Self-pay | Admitting: General Surgery

## 2013-03-03 MED ORDER — OXYCODONE HCL 5 MG PO TABS: 5.0000 mg | ORAL_TABLET | Freq: Four times a day (QID) | ORAL | Status: DC | PRN

## 2013-03-03 NOTE — Discharge Summary (Signed)
Physician Discharge Summary  Patient ID: Kenneth Gallagher MRN: [id number] DOB/AGE: 1985/12/17 27 y.o.  Admit date: 03/01/2013 Discharge date: 03/03/2013  Admitting Diagnosis: Abdominal pain Nausea and vomiting Cholelithiasis, possible cholecystitis Abnormally dilated appendix  Discharge Diagnosis Cholecystitis Dilated appendix with cyst on meso-appendix  Consultants None  Imaging: Dg Cholangiogram Operative  03/02/2013  *RADIOLOGY REPORT*  Clinical Data:   Cholelithiasis  INTRAOPERATIVE CHOLANGIOGRAM  Technique:  Cholangiographic images from the C-arm fluoroscopic device were submitted for interpretation post-operatively.  Please see the procedural report for the amount of contrast and the fluoroscopy time utilized.  Comparison:  03/01/2013  Findings:  Intraoperative cholangiogram performed during the Laproscopic cholecystectomy.  Opacified biliary system is patent. No definite retained stone.  Contrast opacifies duodenum.  No significant dilatation or obstruction.  IMPRESSION: Patent biliary system   Original Report Authenticated By: Judie Petit. Miles Costain, M.D.    US Abdomen Complete  03/01/2013  *RADIOLOGY REPORT*  Clinical Data:  Abdominal pain  ULTRASOUND ABDOMEN:  Technique:  Sonography of upper abdominal structures was performed.  Comparison:  None Correlation:  CT abdomen 03/01/2013  Gallbladder:  Shadowing calculi in gallbladder up to 13 mm diameter.  Gallbladder wall is thickened up to 5 mm thick.  No definite pericholecystic fluid.  Unable to assess for presence of a sonographic Murphy's sign due to prior medication administration, per technologist.  Common bile duct:  5 mm diameter, normal  Liver:  Normal appearance.  Hepatopetal portal venous flow.  IVC:  Normal appearance  Pancreas:  Normal appearance  Spleen:  Normal appearance, 5.7 cm length  Right kidney:  11.2 cm length. Normal morphology without mass or hydronephrosis.  Left kidney:  11.5 cm length. Normal morphology without mass or  hydronephrosis.  Aorta:  Bifurcation obscured by bowel gas. Visualized aorta normal caliber.  Other:  No right upper quadrant ascites identified.  Minimal right pleural effusion noted.  IMPRESSION: Multiple shadowing calculi evident within gallbladder with associated gallbladder wall thickening, raising question of acute cholecystitis. Unable to assess for presence of a sonographic Murphy's sign due to reported prior medication administration. No biliary dilatation. Minimal right pleural effusion.   Original Report Authenticated By: Ulyses Southward, M.D.    Ct Abdomen Pelvis W Contrast  03/01/2013  *RADIOLOGY REPORT*  Clinical Data: Diffuse abdominal pain with nausea vomiting.  CT ABDOMEN AND PELVIS WITH CONTRAST  Technique:  Multidetector CT imaging of the abdomen and pelvis was performed following the standard protocol during bolus administration of intravenous contrast.  Contrast: 80mL OMNIPAQUE IOHEXOL 300 MG/ML  SOLN the  Comparison: None.  Findings: Dependent atelectasis noted in the lung bases.  No focal abnormalities seen in the liver or spleen.  The stomach, duodenum, pancreas, and adrenal glands are unremarkable. Tumefactive sludge or stones noted in the lumen of the gallbladder. The kidneys are normal in appearance.  No abdominal aortic aneurysm.  No free fluid or lymphadenopathy in the abdomen. Imaging through the pelvis shows no free intraperitoneal fluid.  The bladder is unremarkable.  No evidence for colonic diverticulitis.  The terminal ileum is normal.  The base of the appendix is abnormally thickened at 11 mm diameter. The walls are somewhat ill-defined, but there is not a substantial amount of periappendiceal edema or inflammation.  The midportion of the appendix and the tip of the appendix are nondilated. Although the terminal ileum and cecum are well opacified, no contrast flows into the appendix.  Bone windows reveal no worrisome lytic or sclerotic osseous lesions.  IMPRESSION: There is abnormal  thickening  involving the base of the appendix where it measures up to 11 mm in diameter. The appendiceal lumen is not opacified.  The mid appendix and tip in the appendix are nondilated. Although there is not a substantial amount of periappendiceal edema or inflammation, acute appendicitis cannot be excluded.  Several rounded areas of altered attenuation in the gallbladder lumen.  These are probably uncalcified stones although tumefactive sludge could have this appearance.  I discussed these findings by telephone with Dr. Jeraldine Loots at approximately [phone] hours on 03/01/2013.   Original Report Authenticated By: Kennith Center, M.D.     Procedures Dr. Janee Morn (03/02/13) Laparoscopic cholecystectomy with [facility] Laparoscopic appendectomy  Hospital Course:  27 y/o male presents to [facility] after 3 days of progressively worsening abdominal pain. He notes on Friday night he went out drinking with his friend. He is from [place] here visiting a friend in the [place] area. He states on Saturday am he started having abdominal pain and later that afternoon he was having nausea and vomiting. He was having trouble keeping it down. The pain became so unbearable today that he came to the hospital for evaluation. The patient said the pain was in his lower abdomen. It was also associated with diarrhea and anorexia. No blood in emesis or stools.   Workup showed transaminitis (AST 507, ALT 557), elevated total bilirubin (3.6), elevated alk phos (210). CT showed abnormal thickening of the base of the appendix (11mm), and the gallbladder on ultrasound showed gallbladder wall thickened, stones and sludge. No biliary dilatation. Right pleural effusion on ultrasound, normal liver on CT.  The patient was asymptomatic at time of exam (no abdominal pain, N/V).   Patient was admitted and underwent procedure listed above.  He tolerated procedure well and was transferred to the floor.  Diet was advanced as tolerated.  On POD #1, the  patient was voiding well, tolerating diet, ambulating well, pain well controlled, vital signs stable, incisions c/d/i and felt stable for discharge home.  Patient will follow up in our office in 2 weeks and knows to call with questions or concerns.  Dr. Janee Morn will follow up with him on Friday or Monday regarding the pathology report results on the appendix.    Physical Exam: General:  Alert, NAD, pleasant, comfortable Abd:  Soft, ND, mild tenderness, incisions C/D/I    Medication List    TAKE these medications       oxyCODONE 5 MG immediate release tablet  Commonly known as:  Oxy IR/ROXICODONE  Take 1-2 tablets (5-10 mg total) by mouth every 6 (six) hours as needed (pain).             Follow-up Information   Follow up with Liz Malady, MD On 03/17/2013. (9:00am, arrive by 8:45am)    Contact information:   [address] [place] [address] [phone]       Signed: Candiss Norse [facility] [phone]  03/03/2013, 7:53 AM

## 2013-03-03 NOTE — Discharge Summary (Signed)
Annaly Skop, MD, MPH, FACS Pager: [phone]  

## 2013-03-03 NOTE — Progress Notes (Signed)
Pt discharged to home

## 2013-03-03 NOTE — Telephone Encounter (Signed)
I called the patient and gave him the pathology report.  He also asked about his pain medicine.  He has been taking oxycodone 5mg  Q6H but it is wearing off too soon. He asked for Vicodin.  I told him he can take oxycodone 10mg  Q6H PRN.  I will not add Vicodin.  He will call the office PRN.

## 2013-03-04 ENCOUNTER — Encounter (HOSPITAL_COMMUNITY): Payer: Self-pay | Admitting: General Surgery

## 2013-03-17 ENCOUNTER — Ambulatory Visit (INDEPENDENT_AMBULATORY_CARE_PROVIDER_SITE_OTHER): Payer: Self-pay | Admitting: General Surgery

## 2013-03-17 ENCOUNTER — Encounter (INDEPENDENT_AMBULATORY_CARE_PROVIDER_SITE_OTHER): Payer: Self-pay | Admitting: General Surgery

## 2013-03-17 VITALS — BP 120/82 | HR 72 | Temp 98.0°F | Resp 18 | Ht 68.0 in | Wt 185.0 lb

## 2013-03-17 DIAGNOSIS — D490 Neoplasm of unspecified behavior of digestive system: Secondary | ICD-10-CM

## 2013-03-17 DIAGNOSIS — D373 Neoplasm of uncertain behavior of appendix: Secondary | ICD-10-CM | POA: Insufficient documentation

## 2013-03-17 DIAGNOSIS — K810 Acute cholecystitis: Secondary | ICD-10-CM

## 2013-03-17 MED ORDER — OXYCODONE HCL 5 MG PO TABS: 5.0000 mg | ORAL_TABLET | Freq: Four times a day (QID) | ORAL | Status: DC | PRN

## 2013-03-17 NOTE — Progress Notes (Signed)
Subjective:     Patient ID: Kenneth Gallagher, male   DOB: 25-Aug-1986, 25 y.o.   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 He is still having soreness. He is eating and moving his bowels.  Review of Systems     Objective:   Physical Exam Abdomen soft and nontender. All 5 incisions are healing well. No signs of infection.    Assessment:     Doing well status post laparoscopic cholecystectomy and appendectomy    Plan:     I refilled his pain medication. I instructed him he should gradually taper these. He should not need a refill. We will see him back as needed. We again discussed his pathology results.

## 2014-05-24 DIAGNOSIS — Z8701 Personal history of pneumonia (recurrent): Secondary | ICD-10-CM | POA: Insufficient documentation

## 2014-05-24 DIAGNOSIS — R011 Cardiac murmur, unspecified: Secondary | ICD-10-CM | POA: Insufficient documentation

## 2014-05-24 DIAGNOSIS — F17200 Nicotine dependence, unspecified, uncomplicated: Secondary | ICD-10-CM | POA: Insufficient documentation

## 2014-05-24 DIAGNOSIS — R112 Nausea with vomiting, unspecified: Secondary | ICD-10-CM | POA: Insufficient documentation

## 2014-05-24 DIAGNOSIS — X58XXXA Exposure to other specified factors, initial encounter: Secondary | ICD-10-CM | POA: Insufficient documentation

## 2014-05-24 DIAGNOSIS — S301XXA Contusion of abdominal wall, initial encounter: Secondary | ICD-10-CM | POA: Insufficient documentation

## 2014-05-24 DIAGNOSIS — Z9089 Acquired absence of other organs: Secondary | ICD-10-CM | POA: Insufficient documentation

## 2014-05-24 DIAGNOSIS — Y9389 Activity, other specified: Secondary | ICD-10-CM | POA: Insufficient documentation

## 2014-05-24 DIAGNOSIS — Y9289 Other specified places as the place of occurrence of the external cause: Secondary | ICD-10-CM | POA: Insufficient documentation

## 2014-05-25 ENCOUNTER — Emergency Department (HOSPITAL_COMMUNITY): Payer: BC Managed Care – PPO

## 2014-05-25 ENCOUNTER — Emergency Department (HOSPITAL_COMMUNITY)
Admission: EM | Admit: 2014-05-25 | Discharge: 2014-05-25 | Disposition: A | Discharge status: Home / Self Care | Payer: BC Managed Care – PPO | Attending: Emergency Medicine | Admitting: Emergency Medicine

## 2014-05-25 ENCOUNTER — Encounter (HOSPITAL_COMMUNITY): Payer: Self-pay | Admitting: Emergency Medicine

## 2014-05-25 DIAGNOSIS — R1013 Epigastric pain: Secondary | ICD-10-CM

## 2014-05-25 DIAGNOSIS — S301XXA Contusion of abdominal wall, initial encounter: Secondary | ICD-10-CM

## 2014-05-25 LAB — COMPREHENSIVE METABOLIC PANEL
ALBUMIN: 4.3 g/dL (ref 3.5–5.2)
ALK PHOS: 111 U/L (ref 39–117)
ALT: 89 U/L — AB (ref 0–53)
AST: 61 U/L — AB (ref 0–37)
BILIRUBIN TOTAL: 0.3 mg/dL (ref 0.3–1.2)
BUN: 14 mg/dL (ref 6–23)
CHLORIDE: 102 meq/L (ref 96–112)
CO2: 26 mEq/L (ref 19–32)
CREATININE: 1.1 mg/dL (ref 0.50–1.35)
Calcium: 9.9 mg/dL (ref 8.4–10.5)
GFR calc Af Amer: >90 mL/min (ref >90)
GFR calc non Af Amer: >90 mL/min (ref >90)
Glucose, Bld: 95 mg/dL (ref 70–99)
POTASSIUM: 4.4 meq/L (ref 3.7–5.3)
SODIUM: 143 meq/L (ref 137–147)
Total Protein: 8.2 g/dL (ref 6.0–8.3)

## 2014-05-25 LAB — CBC WITH DIFFERENTIAL/PLATELET
BASOS ABS: 0 10*3/uL (ref 0.0–0.1)
BASOS PCT: 0 % (ref 0–1)
Eosinophils Absolute: 0.2 10*3/uL (ref 0.0–0.7)
Eosinophils Relative: 2 % (ref 0–5)
HCT: 43 % (ref 39.0–52.0)
Hemoglobin: 15.1 g/dL (ref 13.0–17.0)
LYMPHS PCT: 31 % (ref 12–46)
Lymphs Abs: 3.1 10*3/uL (ref 0.7–4.0)
MCH: 31.3 pg (ref 26.0–34.0)
MCHC: 35.1 g/dL (ref 30.0–36.0)
MCV: 89.2 fL (ref 78.0–100.0)
Monocytes Absolute: 0.7 10*3/uL (ref 0.1–1.0)
Monocytes Relative: 7 % (ref 3–12)
NEUTROS ABS: 5.9 10*3/uL (ref 1.7–7.7)
NEUTROS PCT: 60 % (ref 43–77)
PLATELETS: 206 10*3/uL (ref 150–400)
RBC: 4.82 MIL/uL (ref 4.22–5.81)
RDW: 12.8 % (ref 11.5–15.5)
WBC: 9.9 10*3/uL (ref 4.0–10.5)

## 2014-05-25 LAB — LIPASE, BLOOD: Lipase: 58 U/L (ref 11–59)

## 2014-05-25 MED ORDER — IBUPROFEN 600 MG PO TABS: 600.0000 mg | ORAL_TABLET | Freq: Four times a day (QID) | ORAL | Status: AC | PRN

## 2014-05-25 NOTE — ED Provider Notes (Signed)
CSN: [id number]     Arrival date & time 05/24/14  2359 History   First MD Initiated Contact with Patient 05/25/14 0414     Chief Complaint  Patient presents with  . Abdominal Pain     (Consider location/radiation/quality/duration/timing/severity/associated sxs/prior Treatment) HPI Comments: 28 Y/O MALE S/P APPENDECTOMY AND CHOLECYSTECTOMY comes in to the ER with epigastric abd pain. Pt reports that he went out fishing on Saturday. Whilst fishing, he was resting the fishing pole on his epigastrium. On his drive home, he started having epigastric pain. Since then he has has persistent pain in the epigastrium, when pushing on the abdomen. At rest he is pain free. Today he had emesis x 3 as well, so he decided to come to the ER.  Patient is a 28 y.o. male presenting with abdominal pain. The history is provided by the patient.  Abdominal Pain Associated symptoms: nausea and vomiting   Associated symptoms: no chest pain, no cough, no dysuria and no shortness of breath     Past Medical History  Diagnosis Date  . Heart murmur     resolved as a child  . Pneumonia     when a child   Past Surgical History  Procedure Laterality Date  . No past surgeries    . Cholecystectomy N/A 03/02/2013    Procedure: LAPAROSCOPIC CHOLECYSTECTOMY WITH INTRAOPERATIVE CHOLANGIOGRAM;  Surgeon: Zenovia Jarred, MD;  Location: [place];  Service: General;  Laterality: N/A;  . Laparoscopic appendectomy N/A 03/02/2013    Procedure: APPENDECTOMY LAPAROSCOPIC;  Surgeon: Zenovia Jarred, MD;  Location: [place];  Service: General;  Laterality: N/A;   History reviewed. No pertinent family history. History  Substance Use Topics  . Smoking status: Current Every Day Smoker    Types: Cigarettes  . Smokeless tobacco: Not on file     Comment: Smokes 1-2 cigs everytime he drinks etoh (1-2x/week)  . Alcohol Use: 1.5 - 2.0 oz/week    3-4 drink(s) per week    Review of Systems  Constitutional: Negative for activity change and  appetite change.  Respiratory: Negative for cough and shortness of breath.   Cardiovascular: Negative for chest pain.  Gastrointestinal: Positive for nausea, vomiting and abdominal pain.  Genitourinary: Negative for dysuria.      Allergies  Review of patient's allergies indicates no known allergies.  Home Medications   Prior to Admission medications   Medication Sig Start Date End Date Taking? Authorizing Provider  ibuprofen (ADVIL,MOTRIN) 200 MG tablet Take 800 mg by mouth every 6 (six) hours as needed for moderate pain.   Yes Historical Provider, MD  ibuprofen (ADVIL,MOTRIN) 600 MG tablet Take 1 tablet (600 mg total) by mouth every 6 (six) hours as needed. 05/25/14   Ankit Nanavati, MD   BP 150/93  Pulse 79  Temp(Src) 99.3 F (37.4 C) (Oral)  Resp 18  Wt 216 lb 9.6 oz (98.249 kg)  SpO2 99% Physical Exam  Nursing note and vitals reviewed. Constitutional: He is oriented to person, place, and time. He appears well-developed.  HENT:  Head: Normocephalic and atraumatic.  Eyes: Conjunctivae and EOM are normal. Pupils are equal, round, and reactive to light.  Neck: Normal range of motion. Neck supple.  Cardiovascular: Normal rate and regular rhythm.   Pulmonary/Chest: Effort normal and breath sounds normal.  Abdominal: Soft. Bowel sounds are normal. He exhibits no distension. There is tenderness. There is no rebound and no guarding.  Epigastric tenderness, with palpation, no peritoneal signs. No ecchymoses, no mass palpated.  Neurological: He is alert and oriented to person, place, and time.  Skin: Skin is warm.    ED Course  Procedures (including critical care time) Labs Review Labs Reviewed  COMPREHENSIVE METABOLIC PANEL - Abnormal; Notable for the following:    AST 61 (*)    ALT 89 (*)    All other components within normal limits  CBC WITH DIFFERENTIAL  LIPASE, BLOOD    Imaging Review Dg Chest Port 1 View  05/25/2014   CLINICAL DATA:  Abdominal pain.  EXAM:  PORTABLE CHEST - 1 VIEW  COMPARISON:  None.  FINDINGS: The lungs are well-aerated and clear. There is no evidence of focal opacification, pleural effusion or pneumothorax.  The cardiomediastinal silhouette is within normal limits. No acute osseous abnormalities are seen.  IMPRESSION: No acute cardiopulmonary process seen.   Electronically Signed   By: Garald Balding M.D.   On: 05/25/2014 04:41     EKG Interpretation None      MDM   Final diagnoses:  Abdominal wall hematoma  Epigastric pain    Pt with epigastric pain, present only with palpation. Worsening since Sunday. Resting the fishing pole for a long duration on his epigastrium. No hard signs for perforation, or internal bleeding based on exam (no ecchymoses, no rigidity, no rebound).  Labs and Xrays are normal.  Advised to return to the ER if sx worsening, as then, it might be a good idea to eval for duodenal hematoma.     Varney Biles, MD 05/25/14 [phone]

## 2014-05-25 NOTE — Discharge Instructions (Signed)
We saw you in the ER for the abdominal pain. All the results in the ER are normal, labs and imaging. We suspect abdominal wall contusion or hematoma (read below). The workup in the ER is not complete, and is limited to screening for life threatening and emergent conditions only, so please see a primary care doctor for further evaluation.  Please return to the ER if your symptoms worsen; you have increased pain, fevers, chills, inability to keep any medications down.    Blunt Abdominal Trauma A blunt injury to the abdomen can cause pain. The pain is most likely from bruising and stretching of your muscles. This pain is often made worse with movement. Most often these injuries are not serious and get better within 1 week with rest and mild pain medicine. However, internal organs (liver, spleen, kidneys) can be injured with blunt trauma. If you do not get better or if you get worse, further examination may be needed. Continue with your regular daily activities, but avoid any strenuous activities until your pain is improved. If your stomach is upset, stick to a clear liquid diet and slowly advance to solid food.  SEEK IMMEDIATE MEDICAL CARE IF:   You develop increasing pain, nausea, or repeated vomiting.  You develop chest pain or breathing difficulty.  You develop blood in the urine, vomit, or stool.  You develop weakness, fainting, fever, or other serious complaints. Document Released: 01/02/2005 Document Revised: 02/17/2012 Document Reviewed: 04/20/2009 [facility] Patient Information 2015 [place], [place]. This information is not intended to replace advice given to you by your health care provider. Make sure you discuss any questions you have with your health care provider.  Contusion A contusion is a deep bruise. Contusions are the result of an injury that caused bleeding under the skin. The contusion may turn blue, purple, or yellow. Minor injuries will give you a painless contusion, but more  severe contusions may stay painful and swollen for a few weeks.  CAUSES  A contusion is usually caused by a blow, trauma, or direct force to an area of the body. SYMPTOMS   Swelling and redness of the injured area.  Bruising of the injured area.  Tenderness and soreness of the injured area.  Pain. DIAGNOSIS  The diagnosis can be made by taking a history and physical exam. An X-ray, CT scan, or MRI may be needed to determine if there were any associated injuries, such as fractures. TREATMENT  Specific treatment will depend on what area of the body was injured. In general, the best treatment for a contusion is resting, icing, elevating, and applying cold compresses to the injured area. Over-the-counter medicines may also be recommended for pain control. Ask your caregiver what the best treatment is for your contusion. HOME CARE INSTRUCTIONS   Put ice on the injured area.  Put ice in a plastic bag.  Place a towel between your skin and the bag.  Leave the ice on for 15-20 minutes, 3-4 times a day, or as directed by your health care provider.  Only take over-the-counter or prescription medicines for pain, discomfort, or fever as directed by your caregiver. Your caregiver may recommend avoiding anti-inflammatory medicines (aspirin, ibuprofen, and naproxen) for 48 hours because these medicines may increase bruising.  Rest the injured area.  If possible, elevate the injured area to reduce swelling. SEEK IMMEDIATE MEDICAL CARE IF:   You have increased bruising or swelling.  You have pain that is getting worse.  Your swelling or pain is not relieved  with medicines. MAKE SURE YOU:   Understand these instructions.  Will watch your condition.  Will get help right away if you are not doing well or get worse. Document Released: 09/04/2005 Document Revised: 11/30/2013 Document Reviewed: 09/30/2011 [facility] Patient Information 2015 [place], [place]. This information is not intended to  replace advice given to you by your health care provider. Make sure you discuss any questions you have with your health care provider.

## 2014-05-25 NOTE — ED Notes (Addendum)
Pt presents with abd pain, nausea, vomiting and diarrhea starting Monday. Pt states the pain is around his umbilical area and started after hw went fishing on Sunday. Pt states he rested his fishing rod on that area. Pt states the pain worsen with movement and/or eating

## 2014-05-25 NOTE — ED Notes (Signed)
Pt A&Ox4, ambulatory at d/c with steady gait, declined wheelchair at discharge. NAD. Pt given an ice pack.

## 2014-06-27 IMAGING — CT CT ABD-PELV W/ CM
2 of 4 series · 14 of 32 positions shown, 19 images · IV contrast (water/omni  & 80ml omni 300)
Comparison: None.

CLINICAL DATA: Diffuse abdominal pain with nausea vomiting.

CT ABDOMEN AND PELVIS WITH CONTRAST
TECHNIQUE: Multidetector CT imaging of the abdomen and pelvis was
performed following the standard protocol during bolus
administration of intravenous contrast.
Contrast: 80mL OMNIPAQUE IOHEXOL 300 MG/ML  SOLN the

[Series 2: routine abdomen · axial · 0.70mm/px · z∈[-393,-93]mm · 7 of 82 slices shown, 12 images]
[im 11/82  soft-tissue]
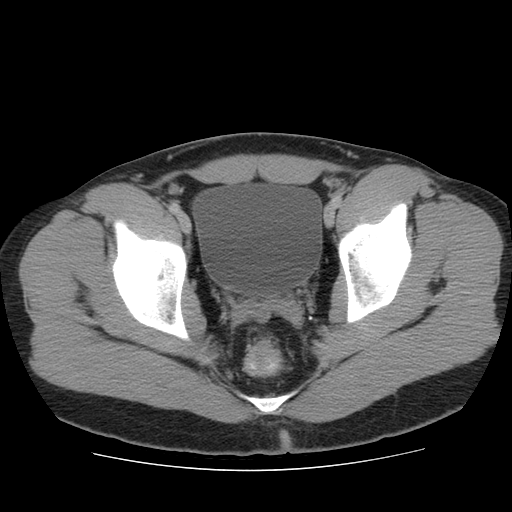
[im 11/82  bone]
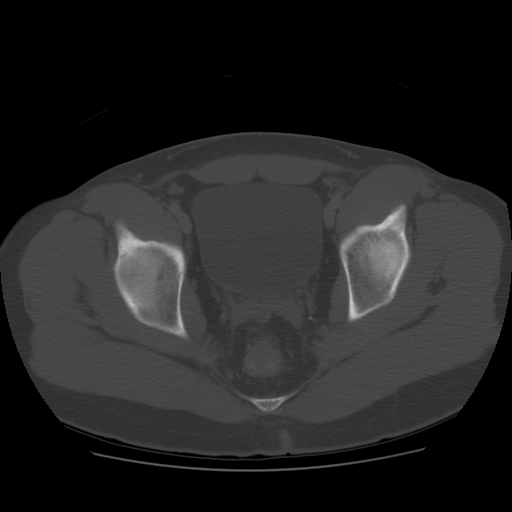
[im 21/82  soft-tissue]
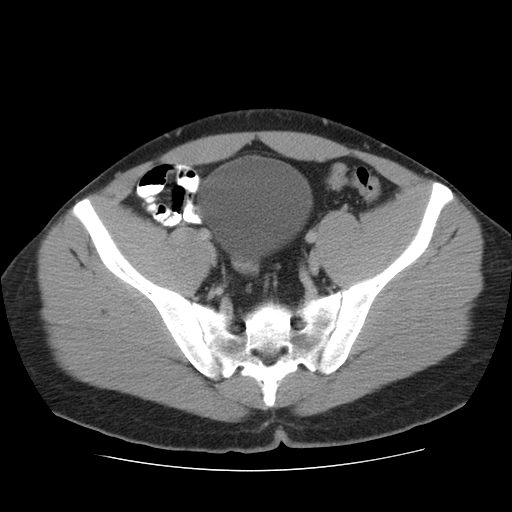
[im 31/82  soft-tissue]
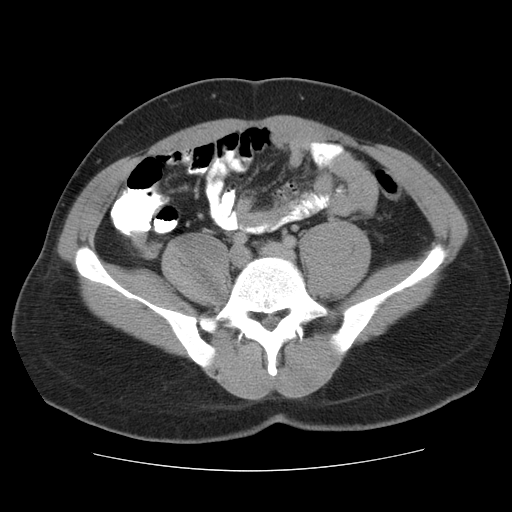
[im 41/82  soft-tissue]
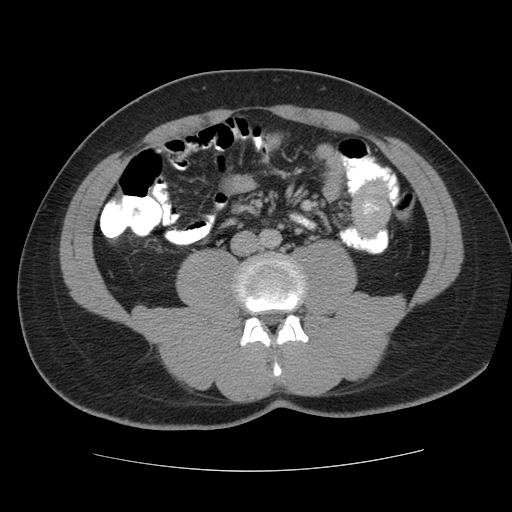
[im 41/82  lung]
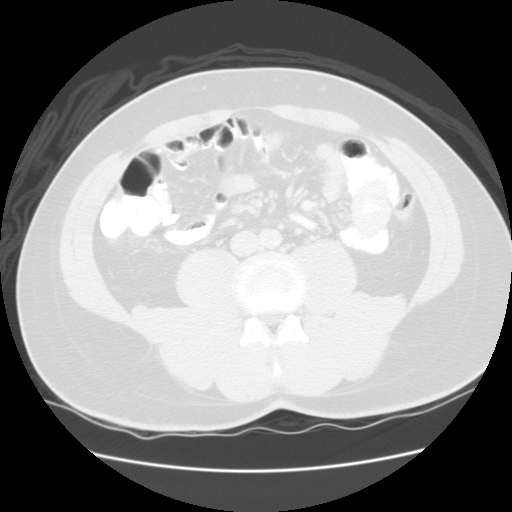
[im 51/82  soft-tissue]
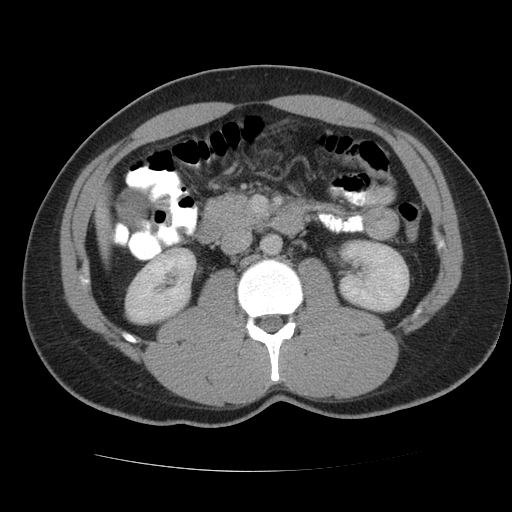
[im 51/82  lung]
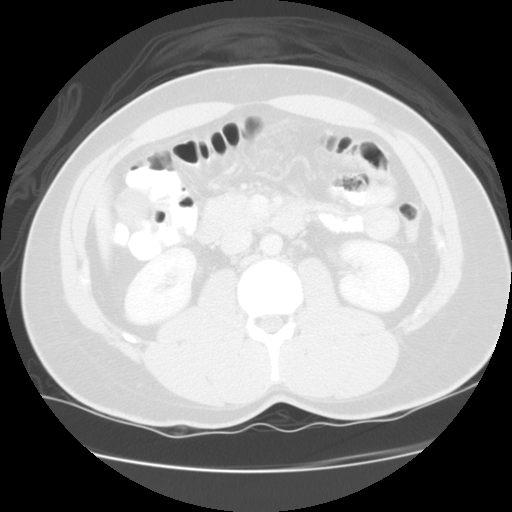
[im 61/82  soft-tissue]
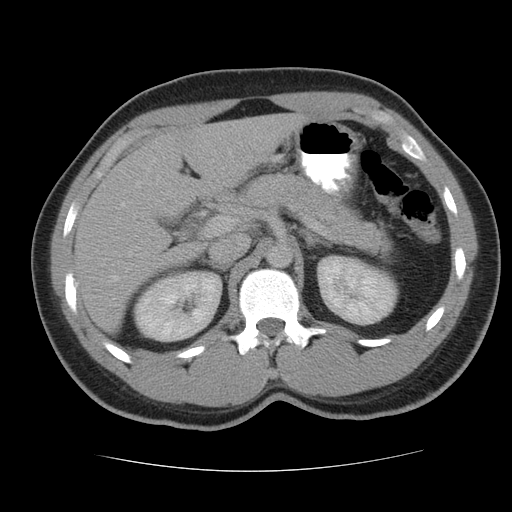
[im 61/82  lung]
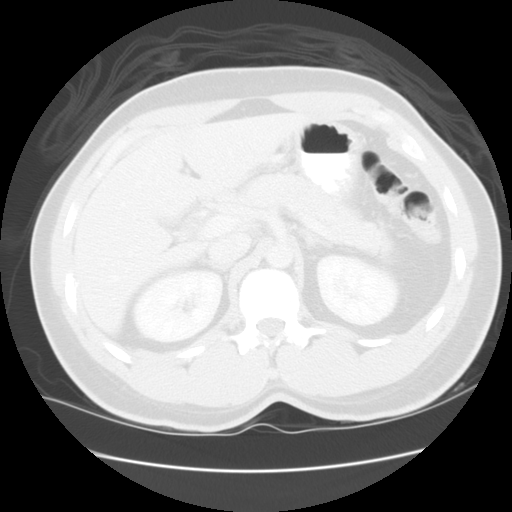
[im 71/82  soft-tissue]
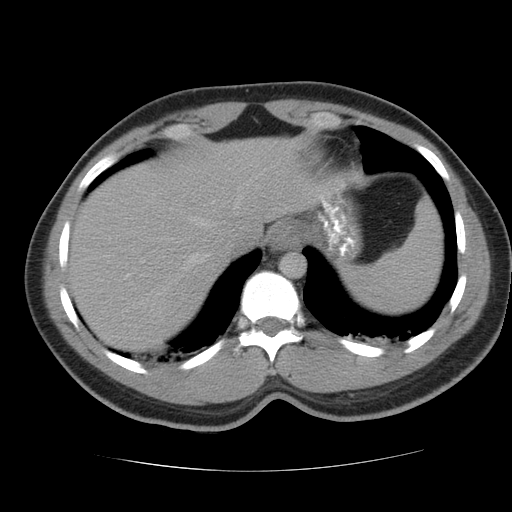
[im 71/82  lung]
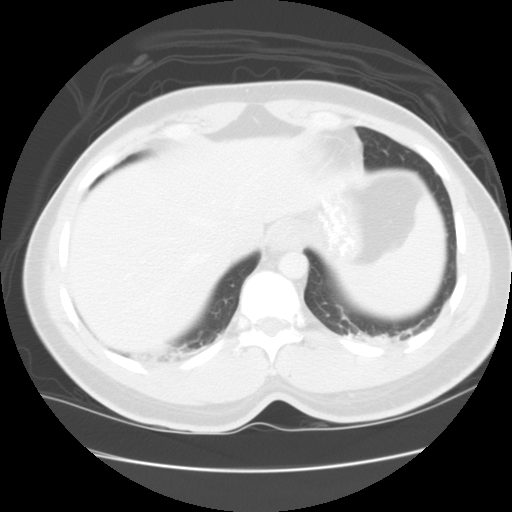

[Series 400: sagittal · sagittal · 0.88mm/px · 7 of 106 slices shown]
[im 10/106  soft-tissue]
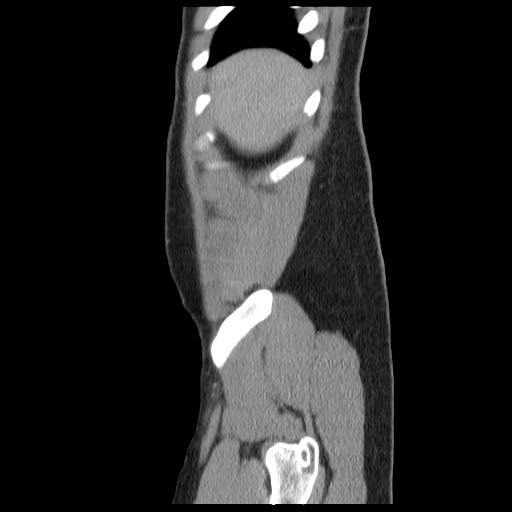
[im 20/106  soft-tissue]
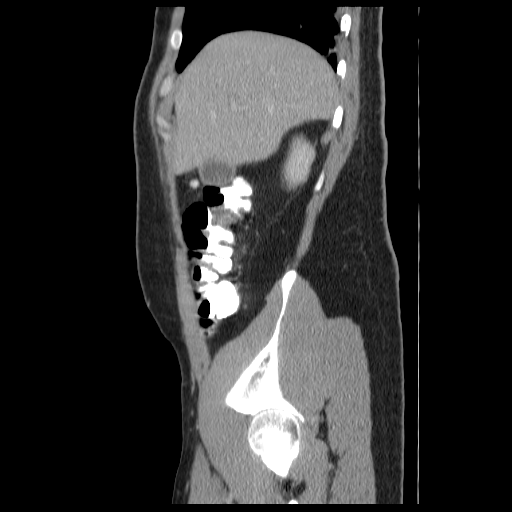
[im 39/106  soft-tissue]
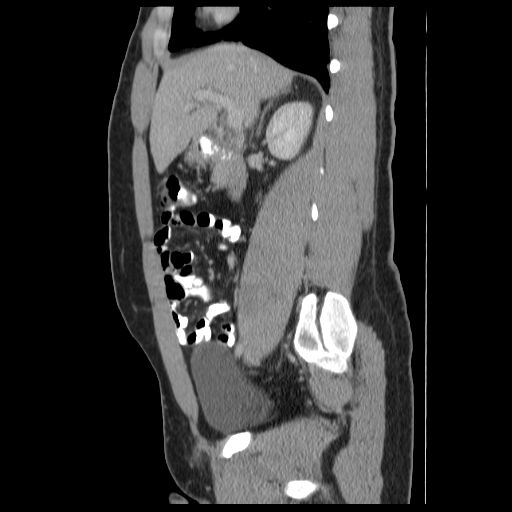
[im 48/106  soft-tissue]
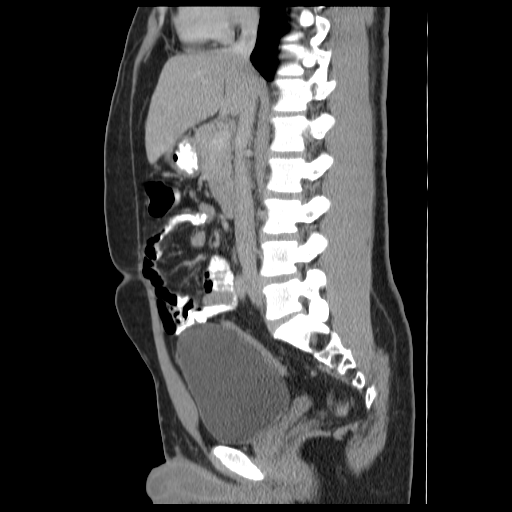
[im 58/106  soft-tissue]
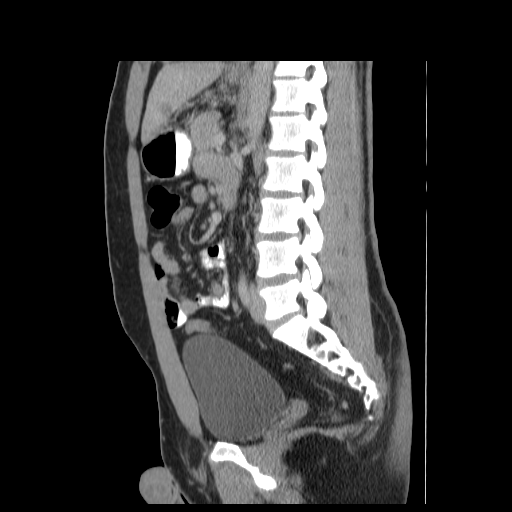
[im 67/106  soft-tissue]
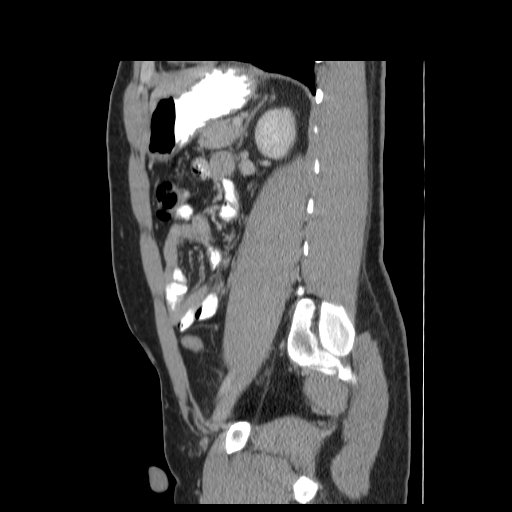
[im 86/106  soft-tissue]
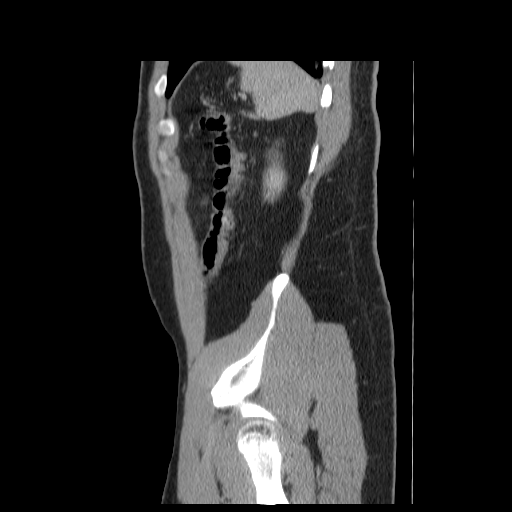

[14 of 32 positions shown; findings below may reference images not displayed]

FINDINGS: Dependent atelectasis noted in the lung bases.

No focal abnormalities seen in the liver or spleen.  The stomach,
duodenum, pancreas, and adrenal glands are unremarkable.
Tumefactive sludge or stones noted in the lumen of the gallbladder.
The kidneys are normal in appearance.

No abdominal aortic aneurysm.  No free fluid or lymphadenopathy in
the abdomen. Imaging through the pelvis shows no free
intraperitoneal fluid.  The bladder is unremarkable.  No evidence
for colonic diverticulitis.  The terminal ileum is normal.

The base of the appendix is abnormally thickened at 11 mm diameter.
The walls are somewhat ill-defined, but there is not a substantial
amount of periappendiceal edema or inflammation.  The midportion of
the appendix and the tip of the appendix are nondilated. Although
the terminal ileum and cecum are well opacified, no contrast flows
into the appendix.

Bone windows reveal no worrisome lytic or sclerotic osseous
lesions.
IMPRESSION: There is abnormal thickening involving the base of the appendix
where it measures up to 11 mm in diameter. The appendiceal lumen is
not opacified.  The mid appendix and tip in the appendix are
nondilated. Although there is not a substantial amount of
periappendiceal edema or inflammation, acute appendicitis cannot be
excluded.

Several rounded areas of altered attenuation in the gallbladder
lumen.  These are probably uncalcified stones although tumefactive
sludge could have this appearance.

I discussed these findings by telephone with Dr. Manjaya at

## 2014-06-28 IMAGING — RF DG CHOLANGIOGRAM OPERATIVE
1 series · 6 of 6 positions shown · non-contrast
Comparison: 03/01/2013

CLINICAL DATA: Cholelithiasis

INTRAOPERATIVE CHOLANGIOGRAM
TECHNIQUE: Cholangiographic images from the C-arm fluoroscopic
device were submitted for interpretation post-operatively.  Please
see the procedural report for the amount of contrast and the
fluoroscopy time utilized.

[Series 1: run · 3 acquisitions, 6 frames shown]
[im 1/3]
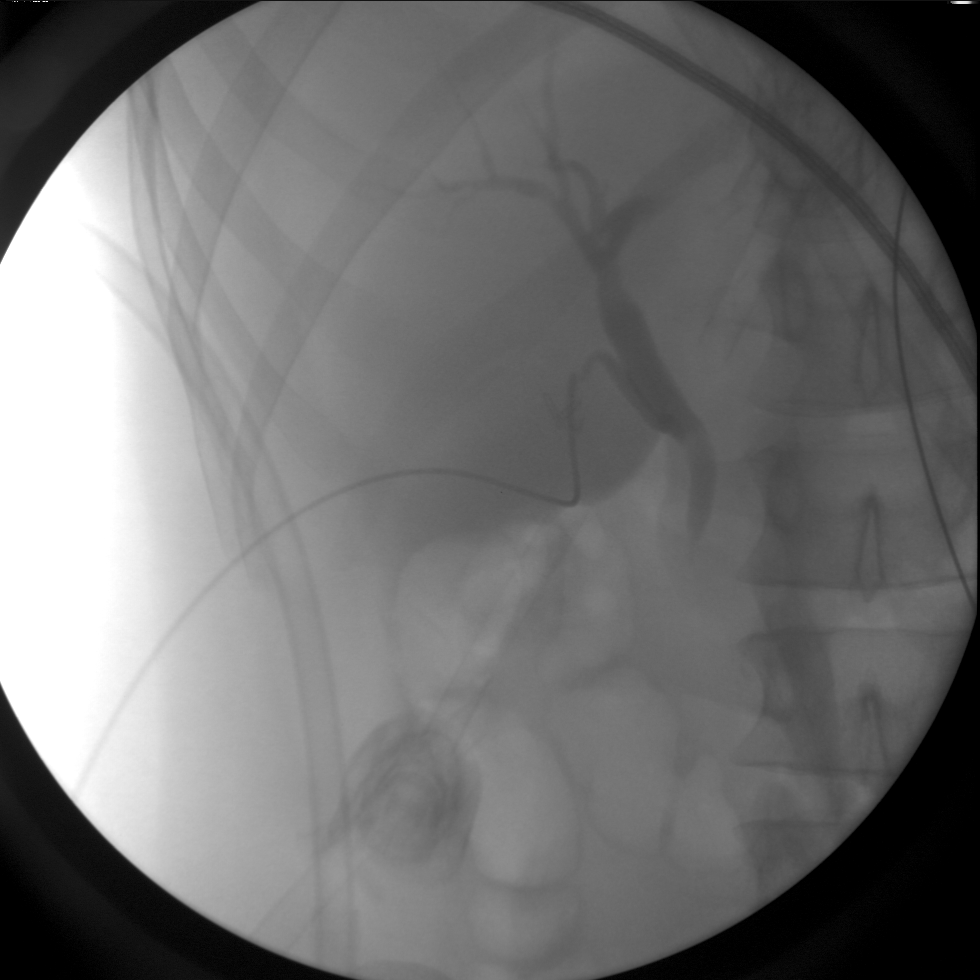
[im 1/3]
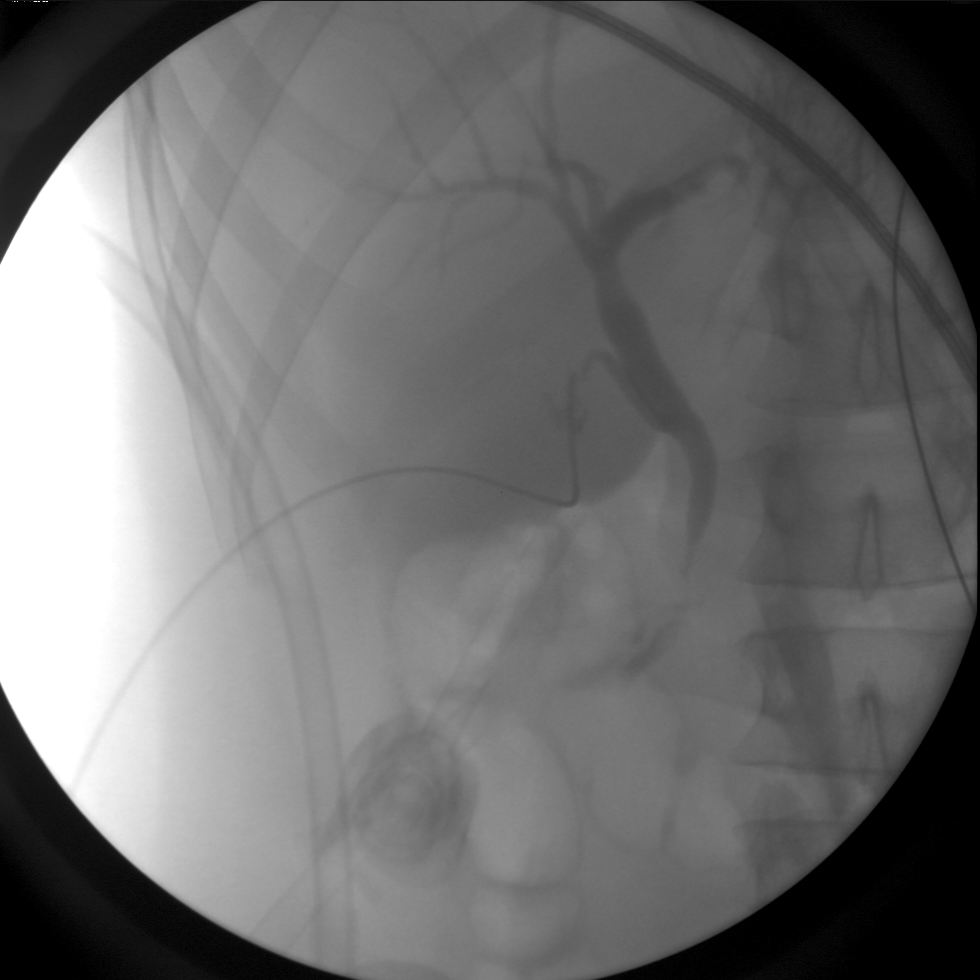
[im 1/3]
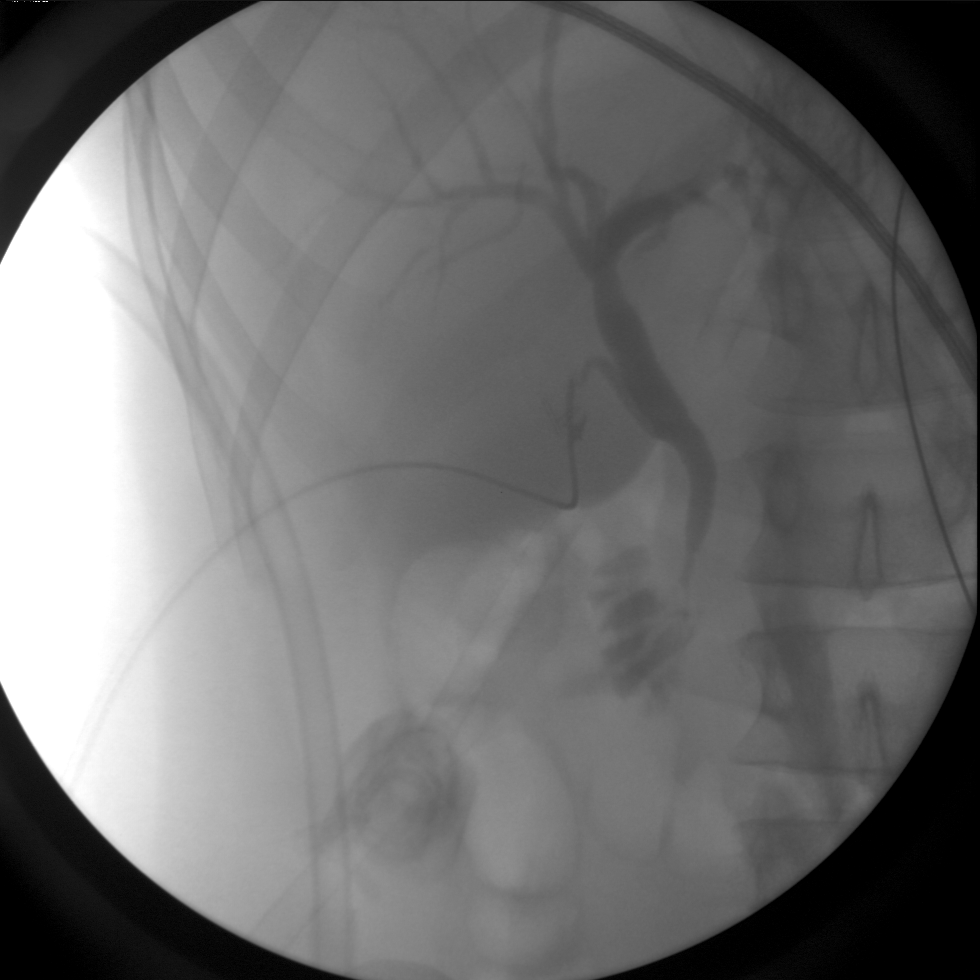
[im 1/3]
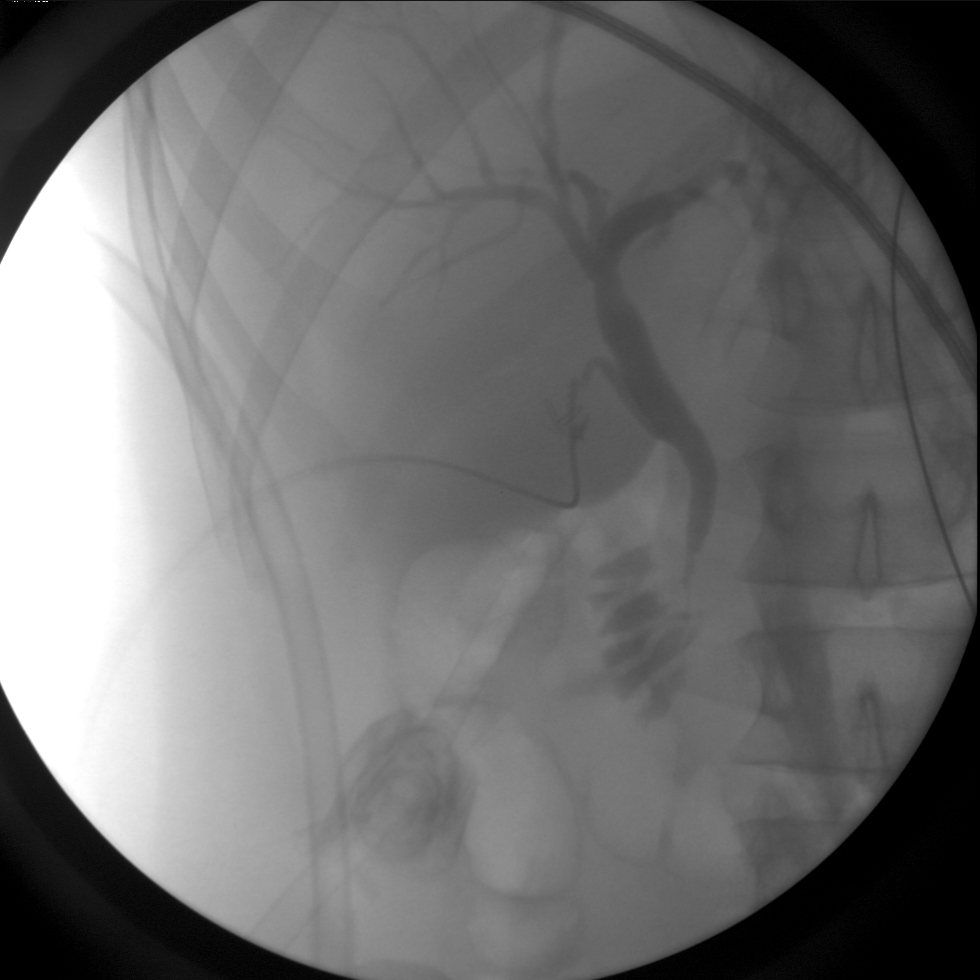
[im 2/3]
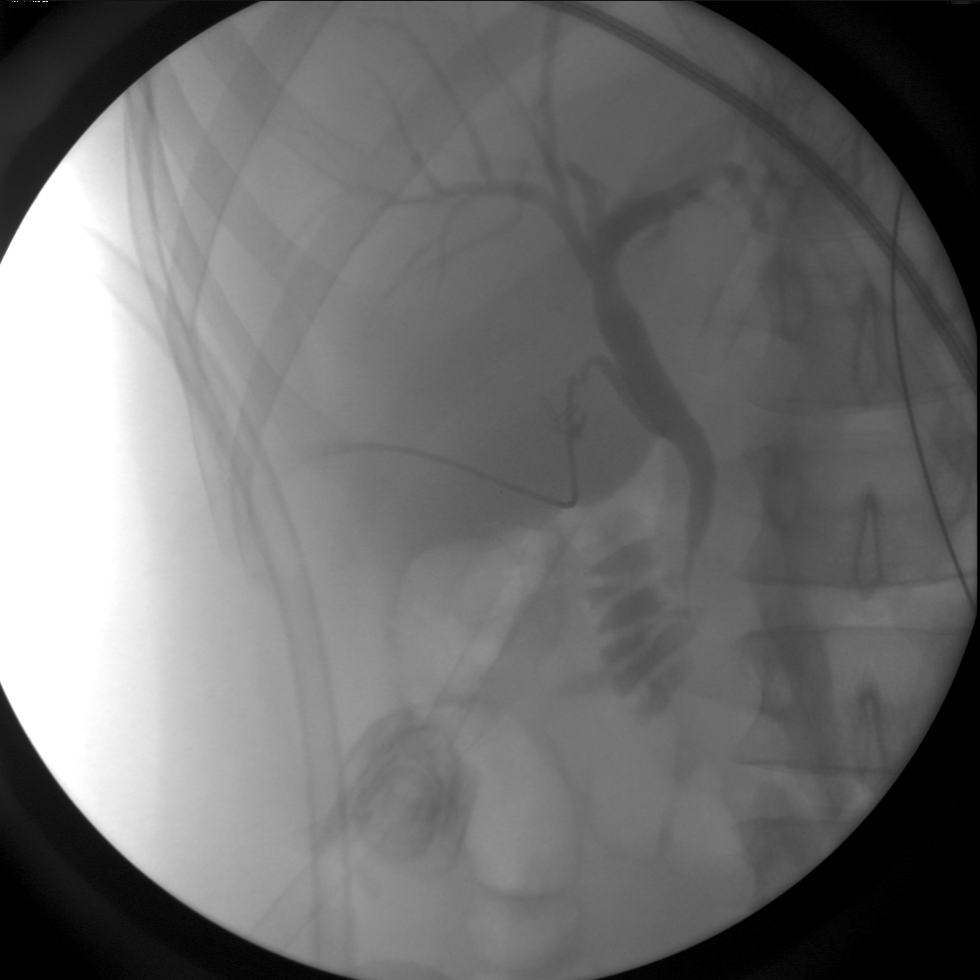
[im 3/3]
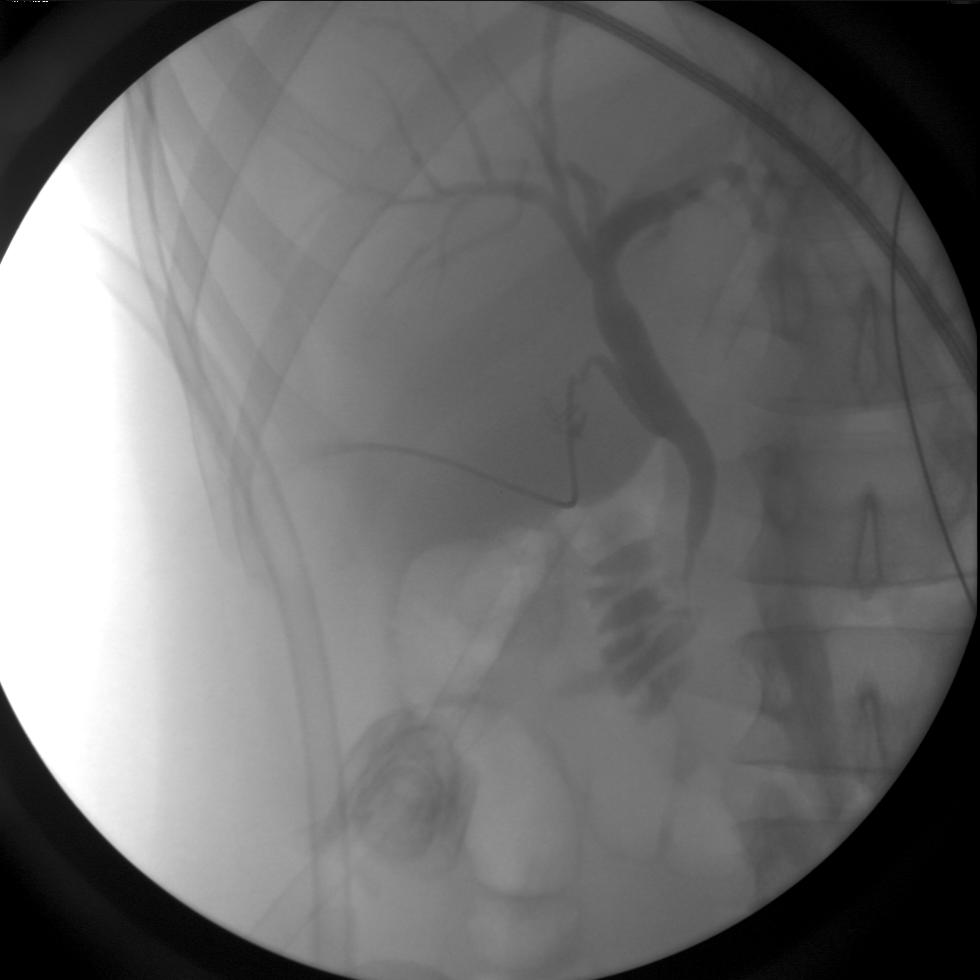

[6 of 6 positions shown; findings below may reference images not displayed]

FINDINGS: Intraoperative cholangiogram performed during the
Laproscopic cholecystectomy.  Opacified biliary system is patent.
No definite retained stone.  Contrast opacifies duodenum.  No
significant dilatation or obstruction.
IMPRESSION: Patent biliary system
